# Patient Record
Sex: Male | Born: 1950 | Hispanic: No | Marital: Married | Smoking: Former smoker
Health system: Southern US, Community
[De-identification: ages and names within clinical notes are randomized; demographics above are authoritative.]

## PROBLEM LIST (undated history)

## (undated) DIAGNOSIS — Z95828 Presence of other vascular implants and grafts: Secondary | ICD-10-CM

## (undated) DIAGNOSIS — I82409 Acute embolism and thrombosis of unspecified deep veins of unspecified lower extremity: Secondary | ICD-10-CM

## (undated) DIAGNOSIS — S2239XA Fracture of one rib, unspecified side, initial encounter for closed fracture: Secondary | ICD-10-CM

## (undated) DIAGNOSIS — S62619A Displaced fracture of proximal phalanx of unspecified finger, initial encounter for closed fracture: Secondary | ICD-10-CM

## (undated) DIAGNOSIS — I2699 Other pulmonary embolism without acute cor pulmonale: Secondary | ICD-10-CM

## (undated) DIAGNOSIS — D500 Iron deficiency anemia secondary to blood loss (chronic): Secondary | ICD-10-CM

## (undated) DIAGNOSIS — IMO0002 Reserved for concepts with insufficient information to code with codable children: Secondary | ICD-10-CM

## (undated) DIAGNOSIS — D136 Benign neoplasm of pancreas: Secondary | ICD-10-CM

## (undated) DIAGNOSIS — S8263XB Displaced fracture of lateral malleolus of unspecified fibula, initial encounter for open fracture type I or II: Secondary | ICD-10-CM

## (undated) DIAGNOSIS — N200 Calculus of kidney: Secondary | ICD-10-CM

## (undated) DIAGNOSIS — R740 Nonspecific elevation of levels of transaminase and lactic acid dehydrogenase [LDH]: Secondary | ICD-10-CM

## (undated) HISTORY — DX: Acute embolism and thrombosis of unspecified deep veins of unspecified lower extremity: I82.409

## (undated) HISTORY — PX: ROTATOR CUFF REPAIR: SHX139

## (undated) HISTORY — DX: Fracture of one rib, unspecified side, initial encounter for closed fracture: S22.39XA

## (undated) HISTORY — DX: Reserved for concepts with insufficient information to code with codable children: IMO0002

## (undated) HISTORY — DX: Other disorders of bilirubin metabolism: E80.6

## (undated) HISTORY — DX: Benign neoplasm of pancreas: D13.6

## (undated) HISTORY — PX: UMBILICAL HERNIA REPAIR: SHX196

## (undated) HISTORY — DX: Displaced fracture of proximal phalanx of unspecified finger, initial encounter for closed fracture: S62.619A

## (undated) HISTORY — DX: Displaced fracture of lateral malleolus of unspecified fibula, initial encounter for open fracture type I or II: S82.63XB

## (undated) HISTORY — DX: Nonspecific elevation of levels of transaminase and lactic acid dehydrogenase (ldh): R74.0

## (undated) HISTORY — DX: Iron deficiency anemia secondary to blood loss (chronic): D50.0

---

## 2003-07-26 ENCOUNTER — Observation Stay (HOSPITAL_COMMUNITY): Admission: RE | Admit: 2003-07-26 | Discharge: 2003-07-27 | Payer: Self-pay | Admitting: Specialist

## 2005-10-13 HISTORY — PX: LITHOTRIPSY: SUR834

## 2006-10-13 HISTORY — PX: OTHER SURGICAL HISTORY: SHX169

## 2010-10-13 DIAGNOSIS — I2699 Other pulmonary embolism without acute cor pulmonale: Secondary | ICD-10-CM

## 2010-10-13 DIAGNOSIS — Z95828 Presence of other vascular implants and grafts: Secondary | ICD-10-CM

## 2010-10-13 DIAGNOSIS — N200 Calculus of kidney: Secondary | ICD-10-CM

## 2010-10-13 HISTORY — PX: CHOLECYSTECTOMY: SHX55

## 2010-10-13 HISTORY — DX: Calculus of kidney: N20.0

## 2010-10-13 HISTORY — DX: Other pulmonary embolism without acute cor pulmonale: I26.99

## 2010-10-13 HISTORY — DX: Presence of other vascular implants and grafts: Z95.828

## 2010-10-13 HISTORY — PX: ERCP W/ SPHICTEROTOMY: SHX1523

## 2012-12-11 DIAGNOSIS — I82409 Acute embolism and thrombosis of unspecified deep veins of unspecified lower extremity: Secondary | ICD-10-CM

## 2012-12-11 HISTORY — DX: Acute embolism and thrombosis of unspecified deep veins of unspecified lower extremity: I82.409

## 2012-12-25 ENCOUNTER — Emergency Department (HOSPITAL_COMMUNITY): Payer: BC Managed Care – PPO

## 2012-12-25 ENCOUNTER — Inpatient Hospital Stay (HOSPITAL_COMMUNITY)
Admission: EM | Admit: 2012-12-25 | Discharge: 2012-12-29 | DRG: 128 | Disposition: A | Discharge status: Home / Self Care | Payer: BC Managed Care – PPO | Attending: Internal Medicine | Admitting: Internal Medicine

## 2012-12-25 ENCOUNTER — Encounter (HOSPITAL_COMMUNITY): Payer: Self-pay

## 2012-12-25 ENCOUNTER — Inpatient Hospital Stay (HOSPITAL_COMMUNITY): Payer: BC Managed Care – PPO

## 2012-12-25 DIAGNOSIS — I82401 Acute embolism and thrombosis of unspecified deep veins of right lower extremity: Secondary | ICD-10-CM

## 2012-12-25 DIAGNOSIS — M79609 Pain in unspecified limb: Secondary | ICD-10-CM

## 2012-12-25 DIAGNOSIS — I80203 Phlebitis and thrombophlebitis of unspecified deep vessels of lower extremities, bilateral: Secondary | ICD-10-CM

## 2012-12-25 DIAGNOSIS — I2699 Other pulmonary embolism without acute cor pulmonale: Secondary | ICD-10-CM

## 2012-12-25 DIAGNOSIS — I80209 Phlebitis and thrombophlebitis of unspecified deep vessels of unspecified lower extremity: Secondary | ICD-10-CM | POA: Diagnosis present

## 2012-12-25 DIAGNOSIS — D62 Acute posthemorrhagic anemia: Secondary | ICD-10-CM | POA: Diagnosis present

## 2012-12-25 DIAGNOSIS — D696 Thrombocytopenia, unspecified: Secondary | ICD-10-CM | POA: Diagnosis present

## 2012-12-25 DIAGNOSIS — I82409 Acute embolism and thrombosis of unspecified deep veins of unspecified lower extremity: Secondary | ICD-10-CM

## 2012-12-25 DIAGNOSIS — Z87891 Personal history of nicotine dependence: Secondary | ICD-10-CM

## 2012-12-25 DIAGNOSIS — Z87442 Personal history of urinary calculi: Secondary | ICD-10-CM

## 2012-12-25 DIAGNOSIS — Z86718 Personal history of other venous thrombosis and embolism: Secondary | ICD-10-CM

## 2012-12-25 DIAGNOSIS — I82403 Acute embolism and thrombosis of unspecified deep veins of lower extremity, bilateral: Secondary | ICD-10-CM

## 2012-12-25 DIAGNOSIS — D649 Anemia, unspecified: Secondary | ICD-10-CM | POA: Diagnosis present

## 2012-12-25 DIAGNOSIS — M7989 Other specified soft tissue disorders: Secondary | ICD-10-CM

## 2012-12-25 DIAGNOSIS — I80299 Phlebitis and thrombophlebitis of other deep vessels of unspecified lower extremity: Principal | ICD-10-CM | POA: Diagnosis present

## 2012-12-25 HISTORY — DX: Calculus of kidney: N20.0

## 2012-12-25 HISTORY — DX: Presence of other vascular implants and grafts: Z95.828

## 2012-12-25 HISTORY — DX: Other pulmonary embolism without acute cor pulmonale: I26.99

## 2012-12-25 LAB — COMPREHENSIVE METABOLIC PANEL
ALT: 12 U/L (ref 0–53)
Alkaline Phosphatase: 81 U/L (ref 39–117)
CO2: 21 mEq/L (ref 19–32)
GFR calc Af Amer: 75 mL/min — ABNORMAL LOW (ref >90)
GFR calc non Af Amer: 65 mL/min — ABNORMAL LOW (ref >90)
Glucose, Bld: 131 mg/dL — ABNORMAL HIGH (ref 70–99)
Potassium: 3.5 mEq/L (ref 3.5–5.1)
Sodium: 133 mEq/L — ABNORMAL LOW (ref 135–145)

## 2012-12-25 LAB — FIBRINOGEN
Fibrinogen: 280 mg/dL (ref 204–475)
Fibrinogen: 309 mg/dL (ref 204–475)

## 2012-12-25 LAB — CBC
HCT: 33.8 % — ABNORMAL LOW (ref 39.0–52.0)
MCHC: 34.9 g/dL (ref 30.0–36.0)
Platelets: 113 10*3/uL — ABNORMAL LOW (ref 150–400)
RDW: 12.6 % (ref 11.5–15.5)
WBC: 7.1 10*3/uL (ref 4.0–10.5)

## 2012-12-25 LAB — CBC WITH DIFFERENTIAL/PLATELET
Eosinophils Relative: 2 % (ref 0–5)
Hemoglobin: 12.7 g/dL — ABNORMAL LOW (ref 13.0–17.0)
Lymphocytes Relative: 15 % (ref 12–46)
Lymphs Abs: 1.2 10*3/uL (ref 0.7–4.0)
MCV: 89.3 fL (ref 78.0–100.0)
Monocytes Relative: 10 % (ref 3–12)
Neutrophils Relative %: 72 % (ref 43–77)
Platelets: 132 10*3/uL — ABNORMAL LOW (ref 150–400)
RBC: 4.11 MIL/uL — ABNORMAL LOW (ref 4.22–5.81)
WBC: 7.8 10*3/uL (ref 4.0–10.5)

## 2012-12-25 LAB — URINALYSIS, ROUTINE W REFLEX MICROSCOPIC
Hgb urine dipstick: NEGATIVE
Specific Gravity, Urine: >1.046 — ABNORMAL HIGH (ref 1.005–1.030)
Urobilinogen, UA: 1 mg/dL (ref 0.0–1.0)
pH: 7.5 (ref 5.0–8.0)

## 2012-12-25 LAB — HEPARIN LEVEL (UNFRACTIONATED)
Heparin Unfractionated: 0.74 IU/mL — ABNORMAL HIGH (ref 0.30–0.70)
Heparin Unfractionated: 0.94 IU/mL — ABNORMAL HIGH (ref 0.30–0.70)

## 2012-12-25 LAB — MRSA PCR SCREENING: MRSA by PCR: NEGATIVE

## 2012-12-25 MED ORDER — SODIUM CHLORIDE 0.9 % IV SOLN: 250.0000 mL | INTRAVENOUS | Status: DC | PRN

## 2012-12-25 MED ORDER — IOHEXOL 300 MG/ML  SOLN
100.0000 mL | Freq: Once | INTRAMUSCULAR | Status: AC | PRN
  Administered 2012-12-25: 100 mL via INTRAVENOUS

## 2012-12-25 MED ORDER — IOHEXOL 300 MG/ML  SOLN: 30.0000 mL | Freq: Once | INTRAMUSCULAR | Status: AC | PRN

## 2012-12-25 MED ORDER — MORPHINE SULFATE 4 MG/ML IJ SOLN
INTRAMUSCULAR | Status: AC
  Filled 2012-12-25: qty 1

## 2012-12-25 MED ORDER — OXYCODONE HCL 5 MG PO TABS: 5.0000 mg | ORAL_TABLET | ORAL | Status: DC | PRN

## 2012-12-25 MED ORDER — HYDROMORPHONE HCL PF 1 MG/ML IJ SOLN
1.0000 mg | Freq: Once | INTRAMUSCULAR | Status: AC
  Administered 2012-12-25: 1 mg via INTRAVENOUS
  Filled 2012-12-25: qty 1

## 2012-12-25 MED ORDER — HEPARIN (PORCINE) IN NACL 100-0.45 UNIT/ML-% IJ SOLN
1100.0000 [IU]/h | INTRAMUSCULAR | Status: DC
  Filled 2012-12-25: qty 250

## 2012-12-25 MED ORDER — ONDANSETRON HCL 4 MG/2ML IJ SOLN: 4.0000 mg | Freq: Four times a day (QID) | INTRAMUSCULAR | Status: DC | PRN

## 2012-12-25 MED ORDER — FENTANYL CITRATE 0.05 MG/ML IJ SOLN
INTRAMUSCULAR | Status: AC
  Filled 2012-12-25: qty 6

## 2012-12-25 MED ORDER — SODIUM CHLORIDE 0.9 % IV SOLN
INTRAVENOUS | Status: DC
  Administered 2012-12-25: 16:00:00 via INTRAVENOUS

## 2012-12-25 MED ORDER — ENOXAPARIN SODIUM 80 MG/0.8ML ~~LOC~~ SOLN
80.0000 mg | Freq: Two times a day (BID) | SUBCUTANEOUS | Status: DC
  Administered 2012-12-25: 80 mg via SUBCUTANEOUS
  Filled 2012-12-25 (×2): qty 0.8

## 2012-12-25 MED ORDER — ALUM & MAG HYDROXIDE-SIMETH 200-200-20 MG/5ML PO SUSP: 30.0000 mL | Freq: Four times a day (QID) | ORAL | Status: DC | PRN

## 2012-12-25 MED ORDER — IOHEXOL 300 MG/ML  SOLN
50.0000 mL | Freq: Once | INTRAMUSCULAR | Status: AC | PRN
  Administered 2012-12-25: 50 mL via ORAL

## 2012-12-25 MED ORDER — SODIUM CHLORIDE 0.9 % IV SOLN
0.2500 mg/h | INTRAVENOUS | Status: DC
  Administered 2012-12-25 – 2012-12-26 (×2): 0.25 mg/h via INTRAVENOUS
  Filled 2012-12-25 (×4): qty 1

## 2012-12-25 MED ORDER — ACETAMINOPHEN 325 MG PO TABS: 650.0000 mg | ORAL_TABLET | Freq: Four times a day (QID) | ORAL | Status: DC | PRN

## 2012-12-25 MED ORDER — FENTANYL CITRATE 0.05 MG/ML IJ SOLN
INTRAMUSCULAR | Status: AC | PRN
  Administered 2012-12-25: 100 ug via INTRAVENOUS

## 2012-12-25 MED ORDER — ACETAMINOPHEN 650 MG RE SUPP: 650.0000 mg | Freq: Four times a day (QID) | RECTAL | Status: DC | PRN

## 2012-12-25 MED ORDER — MORPHINE SULFATE 2 MG/ML IJ SOLN
1.0000 mg | INTRAMUSCULAR | Status: DC | PRN
  Administered 2012-12-25: 1 mg via INTRAVENOUS
  Filled 2012-12-25: qty 1

## 2012-12-25 MED ORDER — SODIUM CHLORIDE 0.9 % IJ SOLN
3.0000 mL | Freq: Two times a day (BID) | INTRAMUSCULAR | Status: DC
  Administered 2012-12-26 – 2012-12-29 (×5): 3 mL via INTRAVENOUS

## 2012-12-25 MED ORDER — LIDOCAINE HCL 1 % IJ SOLN
INTRAMUSCULAR | Status: AC
  Filled 2012-12-25: qty 20

## 2012-12-25 MED ORDER — SODIUM CHLORIDE 0.9 % IJ SOLN: 3.0000 mL | INTRAMUSCULAR | Status: DC | PRN

## 2012-12-25 MED ORDER — HYDROMORPHONE HCL PF 1 MG/ML IJ SOLN
1.0000 mg | INTRAMUSCULAR | Status: DC | PRN
  Administered 2012-12-25 – 2012-12-27 (×7): 1 mg via INTRAVENOUS
  Filled 2012-12-25 (×8): qty 1

## 2012-12-25 MED ORDER — TENECTEPLASE 50 MG IV KIT
0.2500 mg/h | PACK | INTRAVENOUS | Status: DC
  Filled 2012-12-25: qty 1

## 2012-12-25 MED ORDER — HYDROMORPHONE HCL PF 1 MG/ML IJ SOLN
1.0000 mg | INTRAMUSCULAR | Status: DC | PRN
  Administered 2012-12-25: 1 mg via INTRAVENOUS
  Filled 2012-12-25: qty 1

## 2012-12-25 MED ORDER — HEPARIN (PORCINE) IN NACL 100-0.45 UNIT/ML-% IJ SOLN
800.0000 [IU]/h | INTRAMUSCULAR | Status: DC
  Filled 2012-12-25: qty 250

## 2012-12-25 MED ORDER — SODIUM CHLORIDE 0.9 % IV SOLN
INTRAVENOUS | Status: AC
  Administered 2012-12-25 (×2): via INTRAVENOUS

## 2012-12-25 MED ORDER — ONDANSETRON HCL 4 MG/2ML IJ SOLN: 4.0000 mg | Freq: Three times a day (TID) | INTRAMUSCULAR | Status: AC | PRN

## 2012-12-25 MED ORDER — POLYETHYLENE GLYCOL 3350 17 G PO PACK
17.0000 g | PACK | Freq: Every day | ORAL | Status: DC | PRN
  Filled 2012-12-25: qty 1

## 2012-12-25 MED ORDER — TENECTEPLASE 50 MG IV KIT
0.2500 mg/h | PACK | INTRAVENOUS | Status: DC
  Administered 2012-12-25 – 2012-12-27 (×3): 0.25 mg/h via INTRAVENOUS
  Filled 2012-12-25 (×2): qty 1

## 2012-12-25 MED ORDER — HEPARIN (PORCINE) IN NACL 100-0.45 UNIT/ML-% IJ SOLN
1000.0000 [IU]/h | INTRAMUSCULAR | Status: DC
  Administered 2012-12-25: 1000 [IU]/h via INTRAVENOUS
  Filled 2012-12-25: qty 250

## 2012-12-25 MED ORDER — MORPHINE SULFATE 10 MG/ML IJ SOLN
INTRAMUSCULAR | Status: AC | PRN
  Administered 2012-12-25: 2 mg via INTRAVENOUS

## 2012-12-25 MED ORDER — ONDANSETRON HCL 4 MG/2ML IJ SOLN
4.0000 mg | Freq: Once | INTRAMUSCULAR | Status: AC
  Administered 2012-12-25: 4 mg via INTRAVENOUS
  Filled 2012-12-25: qty 2

## 2012-12-25 NOTE — Procedures (Signed)
BLE venous lysis Find - Extensive RLE DVT, L CIV DVT, IVC occlusion Int - BLE popliteal vein acess infusion of TNK 0.25 mg/hr each Comp

## 2012-12-25 NOTE — Progress Notes (Signed)
ANTICOAGULATION CONSULT NOTE - Initial Consult  Pharmacy Consult for Lovenox Indication: New DVT  No Known Allergies  Patient Measurements: Height: 5\' 8"  (172.7 cm) Weight: 172 lb 8 oz (78.245 kg) IBW/kg (Calculated) : 63.9  Vital Signs: Temp: 97.8 F (36.6 C) (03/15 0525) Temp src: Oral (03/15 0525) BP: 133/85 mmHg (03/15 0525) Pulse Rate: 102 (03/15 0525)  Labs:  Recent Labs  12/25/12 0550  HGB 12.7*  HCT 36.7*  PLT 132*  APTT 26  CREATININE 1.17    Estimated Creatinine Clearance: 62.5 ml/min (by C-G formula based on Cr of 1.17).   Medical History: Past Medical History  Diagnosis Date  . Kidney stones   . Pulmonary embolus   . S/P IVC filter     Assessment: 40 yom with h/o PE, s/p IVC filter presented 3/15 with c/o back pain radiating down to R leg/knee.  Patient was not anticoagulated PTA. Preliminary RLE venous duplex positive for extensive, nonocclusive, acute deep vein thrombosis involving the right saphenofemoral junction, common femoral, femoral, popliteal, posterior tibial, and peroneal veins.  MD ordered for Lovenox dosing for VTE treatment.   Patient wts 78.2 kg with a CrCl of 62 ml/min. Baseline Hgb 12.7 and Plts slightly low at 132K.    Goal of Therapy:  Anti-Xa level 0.6-1.2 units/ml 4hrs after LMWH dose given Monitor platelets by anticoagulation protocol: Yes   Plan:   Lovenox 80 mg sq q12h  Follow up plts, changes in weight/renal function, long-term anticoagulation plans  Geoffry Paradise, PharmD, BCPS Pager: [phone] 9:15 AM Pharmacy #: [id number]

## 2012-12-25 NOTE — Progress Notes (Signed)
Report called to [facility] on ICU/Stepdown.  Philomena Doheny RN

## 2012-12-25 NOTE — ED Provider Notes (Signed)
Medical screening examination/treatment/procedure(s) were conducted as a shared visit with non-physician practitioner(s) and myself.  I personally evaluated the patient during the encounter  Pt with lower extremity pain and swelling concerning for DVT. vasuclar study to evaluate for lower extremity DVT. Will CT abd/pelvis. No hypoxia or significant tachycardia at this time to suggest PE. Will obtain cxr. Studies pending at time of my evaluation. Care to be continued by my PA and Dr Juleen China.   Lyanne Co, MD 12/25/12 [phone]

## 2012-12-25 NOTE — Progress Notes (Signed)
Brief Pharmacy Consult Note: Heparin  Please see previous Pharmacy consult note from 09:10 this am for full details. MD now changing from lovenox to IV heparin. Patient already received 80mg  lovenox at approximately 10am today, therefore, will not need to begin heparin until tonight.  Plan:  At 8pm tonight, will begin IV heparin 1100 units/hr without a bolus  Check heparin level in 8hrs   Daily heparin levels and CBC  Loralee Pacas, PharmD, BCPS Pager: [phone] 12/25/2012 12:56 PM

## 2012-12-25 NOTE — ED Provider Notes (Signed)
History     CSN: [id number]  Arrival date & time 12/25/12  4098   First MD Initiated Contact with Patient 12/25/12 [phone]      Chief Complaint  Patient presents with  . Back Pain    (Consider location/radiation/quality/duration/timing/severity/associated sxs/prior treatment) HPI  62 year old male with history of kidney stones, history of PE status post IVC filter presents complaining of multiple symptoms. Patient states 3 days ago he was doing some heavy lifting and experienced some vague pain in his low back. Pain felt like a muscle strain, throbbing and tightness in sensation, improves when he rests and worsen when he walks. For the past 2 days he notice increasing pain to his right thigh with sensation of tightness to his thigh. Pain is throbbing sensation, with minimal pain when he rests but when walking he gets out of breath, having dyspnea on exertion, having nonproductive cough, and felt nauseated. Pain now seems to be more in his right lower quadrant. He has no appetite. He has decreased bowel movement with small amount of loose stool but no blood or mucus.  Patient denies fever but endorsed chills. He denies chest pain but endorsed shortness of breath on exertion. He denies dysuria, hematuria or flank pain. He has not noticed any swelling in his testicle or scrotum. No penile discharge. Has had history of kidney stone the past requiring lithotripsy. Prior hx of abd surgery and cholecystectomy. Not a smoker or a drinker.  Pt reports he did take blood thinner medication for 6 months but were taken off of it.  Does not know reason why.  Report having internal bleeding around that same time and was admitted to ICU.  Did not take blood thinner afterward.   Past Medical History  Diagnosis Date  . Kidney stones   . Pulmonary embolus   . S/P IVC filter     Past Surgical History  Procedure Laterality Date  . Cholecystectomy    . Abdominal surgery      History reviewed. No pertinent  family history.  History  Substance Use Topics  . Smoking status: Former Games developer  . Smokeless tobacco: Not on file  . Alcohol Use: No      Review of Systems  Constitutional:       A complete 10 system review of systems was obtained and all systems are negative except as noted in the HPI and PMH.    Allergies  Review of patient's allergies indicates not on file.  Home Medications  No current outpatient prescriptions on file.  BP 133/85  Pulse 102  Temp(Src) 97.8 F (36.6 C) (Oral)  Resp 24  Ht 5\' 8"  (1.727 m)  Wt 172 lb 8 oz (78.245 kg)  BMI 26.23 kg/m2  SpO2 100%  Physical Exam  Nursing note and vitals reviewed. Constitutional: He appears well-developed and well-nourished. No distress.  Awake, alert, nontoxic appearance  HENT:  Head: Atraumatic.  Eyes: Conjunctivae are normal. Right eye exhibits no discharge. Left eye exhibits no discharge.  Neck: Normal range of motion. Neck supple.  Cardiovascular: Regular rhythm.   Mild tachycardia  Pulmonary/Chest: Effort normal. No respiratory distress. He exhibits no tenderness.  Abdominal: Soft. There is no tenderness (RLQ tenderness without guarding or rebound tenderness.  No CVA tenderness, no hernia). There is no rebound. Hernia confirmed negative in the right inguinal area and confirmed negative in the left inguinal area.  Genitourinary: Testes normal and penis normal. Circumcised.  Musculoskeletal: He exhibits edema (RLE edema, 1+ non pitting.  pedal  pulses palpable but faint.  positive Homan Sign.  ). He exhibits no tenderness.  ROM appears intact, no obvious focal weakness  Negative straight leg raise.  Lymphadenopathy:       Right: No inguinal adenopathy present.       Left: No inguinal adenopathy present.  Neurological: He is alert.  Skin: Skin is warm and dry. No rash noted.  Psychiatric: He has a normal mood and affect.    ED Course  Procedures (including critical care time)  6:36 AM Patient was seen and  evaluated by me for his complaints. Primary concern is possible DVT as patient exhibits right lower extremity edema and has had prior history of PE. Patient also has abdominal tenderness primarily to the right lower quadrant. No obvious hernia noted but due to his age we'll consider abdominal pelvis CT scan for further evaluation. Patient also endorsed shortness of breath, and detail exertion, will obtain chest x-ray. Pain medication often, patient declined. Care discussed with attending.  9:05 AM Vascular ultrasound shows extensive acute thrombosis throughout right lower extremities. Abdominal and pelvic CT scan shows slight soft tissue stranding around the distal inferior vena cava with distention of the distal IVC and iliac vein and femoral vein suggesting acute thrombosis below the IVC filter. Incidental finding of diverticulosis were noted.    9:14 AM On reexamination, pt's RLE is bluish in color suggestive of Phlegmasia cerulea dolens, which is considered to have high mortality/morbidity.  Dorsalis Pedis appreciated using bedside doppler US.  Pt is having significant leg pain after having the vascular US.  Will give pain medication and will consider admission for further management and pain control.  Lovenox dose per pharmacy.  Discuss care with attending.    9:42 AM I have consulted with [facility], Dr. Darnelle Catalan, who agrees to admit pt to [address], [place], under her care.  Holding order and bed request placed. Pt is currently stable, pain is more controlled with pain meds.    Labs Reviewed  CBC WITH DIFFERENTIAL - Abnormal; Notable for the following:    RBC 4.11 (*)    Hemoglobin 12.7 (*)    HCT 36.7 (*)    Platelets 132 (*)    All other components within normal limits  COMPREHENSIVE METABOLIC PANEL - Abnormal; Notable for the following:    Sodium 133 (*)    Glucose, Bld 131 (*)    Total Bilirubin 2.8 (*)    GFR calc non Af Amer 65 (*)    GFR calc Af Amer 75 (*)    All other components  within normal limits  APTT  LIPASE, BLOOD  URINALYSIS, ROUTINE W REFLEX MICROSCOPIC   Dg Chest 2 View  12/25/2012  *RADIOLOGY REPORT*  Clinical Data: Increase shortness of breath with activity.  CHEST - 2 VIEW  Comparison: None.  Findings: Normal heart size and pulmonary vascularity.  Scattered calcified granulomas in the lungs with calcified left hilar lymph nodes.  No focal consolidation or airspace disease.  No blunting of costophrenic angles.  No pneumothorax.  Degenerative changes in the spine.  Surgical clips in the right upper quadrant.  Postoperative change in the right shoulder.  IMPRESSION: No evidence of active pulmonary disease.   Original Report Authenticated By: Burman Nieves, M.D.    Ct Abdomen Pelvis W Contrast  12/25/2012  *RADIOLOGY REPORT*  Clinical Data: Abdominal pain and back pain.  CT ABDOMEN AND PELVIS WITH CONTRAST  Technique:  Multidetector CT imaging of the abdomen and pelvis was performed following  the standard protocol during bolus administration of intravenous contrast.  Contrast: 50mL OMNIPAQUE IOHEXOL 300 MG/ML  SOLN, OMNIPAQUE IOHEXOL 300 MG/ML  SOLN  Comparison: None.  Findings: The patient has an IVC filter in place.  The lower IVC and common iliac and femoral veins are distended and there is slight soft tissue stranding around the distal inferior vena cava. I suspect the patient may have acutely trapped a large thrombus in the inferior vena cava below the filter.  The liver, spleen, pancreas, adrenal glands, and kidneys demonstrate no significant abnormalities.  There is an 18 x 11 x 13 mm lesion in the anterior aspect of the left lobe the liver, probably a small hemangioma.  There is a small parapelvic cyst in the left kidney.  There are a few calcified granulomas in the spleen. Gallbladder has been removed.  There are numerous diverticula in the descending and proximal sigmoid portions of the colon.  No significant osseous abnormality.  IMPRESSION:  1. Slight  soft tissue stranding around the distal inferior vena cava with distention of the distal IVC and iliac veins and femoral vein suggesting acute thrombosis below the IVC filter. 2. Diverticulosis.   Original Report Authenticated By: Francene Boyers, M.D.    Author: Gwendolyn Fill Service: Vascular Lab Author Type: Cardiovascular Sonographer   Filed: 12/25/2012 8:54 AM Note Time: 12/25/2012 8:50 AM     Related Notes: Original Note by Gwendolyn Fill filed at 12/25/2012 8:52 AM       *Preliminary Results*  Right lower extremity venous duplex completed.  Right lower extremity is positive for extensive, nonocclusive, acute deep vein thrombosis involving the right saphenofemoral junction, common femoral, femoral, popliteal, posterior tibial, and peroneal veins. There is no evidence of deep vein thrombosis involving the left saphenofemoral junction or common femoral vein. No evidence of right Baker's cyst.  Preliminary results discussed with Fayrene Helper, PA  12/25/2012 8:52 AM  Gertie Fey, RDMS, RDCS     CRITICAL CARE Performed by: Fayrene Helper   Total critical care time: 40 min  Critical care time was exclusive of separately billable procedures and treating other patients.  Critical care was necessary to treat or prevent imminent or life-threatening deterioration.  Critical care was time spent personally by me on the following activities: development of treatment plan with patient and/or surrogate as well as nursing, discussions with consultants, evaluation of patient's response to treatment, examination of patient, obtaining history from patient or surrogate, ordering and performing treatments and interventions, ordering and review of laboratory studies, ordering and review of radiographic studies, pulse oximetry and re-evaluation of patient's condition.   1. DVT, lower extremity, right    BP 133/85  Pulse 102  Temp(Src) 97.8 F (36.6 C) (Oral)  Resp 24  Ht 5\' 8"  (1.727 m)   Wt 172 lb 8 oz (78.245 kg)  BMI 26.23 kg/m2  SpO2 100%  I have reviewed nursing notes and vital signs. I personally reviewed the imaging tests through PACS system  I reviewed available ER/hospitalization records thought the EMR    MDM          Fayrene Helper, PA-C 12/25/12 8469

## 2012-12-25 NOTE — H&P (Signed)
[facility] History and Physical  ORLANDO DEVEREUX JXB:[id number] DOB: Jun 01, 1951 DOA: 12/25/2012  Referring physician: Dr. Raeford Razor PCP: No primary provider on file. the patient confirms that he does not have a current PCP.  Chief Complaint: RLE swollen    History of Present Illness: Jonathan Ellison is an 62 y.o. male with a PMH nephrolithiasis, diverticulosis, and DVT/PE treated with coumadin x 6 months, status post IVC filter, with no ongoing followup with a PCP, who presents to the hospital with progressive right lower extremity swelling and pain which has worsened over the past 24 hours. Weightbearing and activity exacerbates pain and the pain is so bad now that is interfering with his ability to ambulate. The patient has a history of remote cholecystectomy, and what sounds like an ERCP, complicated by hemorrhage, but this appears to have been a direct complication from the procedure rather than from Coumadin. Patient states that he was off the Coumadin when he underwent the cholecystectomy. The patient originally thought that he was having a flare of his diverticulitis, as he reported lower abdominal and back pain, and subsequently put himself on bowel rest. His initial blood clot appears to been provoked by lithotripsy. Upon initial evaluation emergency department, the patient was found to have a markedly edematous and dusky appearing right leg extending from the foot up to the thigh. The ER physician was able to Doppler a pulse although his pulses were nonpalpable tactilely.  Review of Systems: Constitutional: No fever, + chills;  Appetite diminished; No weight loss, + weight gain.  HEENT: No blurry vision, no diplopia, no pharyngitis, no dysphagia CV: No chest pain, no palpitations.  Resp: + SOB with activity, no cough. GI: + nausea, no vomiting, no diarrhea, no melena, no hematochezia.  GU: No dysuria, no hematuria.  MSK: no myalgias except LLE pain and swelling, + knee arthralgias.   Neuro:  No headache, no focal neurological deficits, no history of seizures.  Psych: No depression, no anxiety.  Endo: No thyroid disease, no DM, no heat intolerance, no cold intolerance, no polyuria, no polydipsia  Skin: No rashes, no skin lesions.  Heme: No easy bruising, no history of blood diseases.  Past Medical History Past Medical History  Diagnosis Date  . Kidney stones   . Pulmonary embolus   . S/P IVC filter      Past Surgical History Past Surgical History  Procedure Laterality Date  . Cholecystectomy    . Abdominal surgery    . Rotator cuff surgery       Social History: History   Social History  . Marital Status: Married    Spouse Name: Alice    Number of Children: 3  . Years of Education: N/A   Occupational History  . Works PT    Social History Main Topics  . Smoking status: Former Games developer  . Smokeless tobacco: Not on file     Comment: Quit smoking 30 years ago.  . Alcohol Use: No  . Drug Use: No  . Sexually Active: Not on file   Other Topics Concern  . Not on file   Social History Narrative   Married.  Independent of ADLs and with ambulation.    Family History:  Family History  Problem Relation Age of Onset  . Cancer Mother     Pancreatic  . Cancer Brother     Head and neck  . Dementia Father     Allergies: Review of patient's allergies indicates no known allergies.  Meds:  Prior to Admission medications   Not on File    Physical Exam: Filed Vitals:   12/25/12 0532 12/25/12 0900 12/25/12 1000 12/25/12 1100  BP:  128/88 101/80 126/72  Pulse:    71  Temp:    98.3 F (36.8 C)  TempSrc:    Oral  Resp:    32  Height: 5\' 8"  (1.727 m)     Weight: 78.245 kg (172 lb 8 oz)     SpO2:    100%     Physical Exam: Blood pressure 126/72, pulse 71, temperature 98.3 F (36.8 C), temperature source Oral, resp. rate 32, height 5\' 8"  (1.727 m), weight 78.245 kg (172 lb 8 oz), SpO2 100.00%. Gen: In moderate distress from severe right leg  pain. Head: Normocephalic, atraumatic. Eyes: PERRL, EOMI, sclerae nonicteric. Mouth: Oropharynx clear with fair dentition. Neck: Supple, no thyromegaly, no lymphadenopathy, no jugular venous distention. Chest: Lungs clear to auscultation bilaterally. CV: Heart sounds regular, without murmurs, rubs, or gallops. Abdomen: Soft, nontender, nondistended with normal active bowel sounds. Extremities: Right lower extremity markedly swollen, dusky, without palpable pedal pulses. Left lower terminate normal. Skin: Warm and dry except for the right leg which is dusky and cool. Neuro: Alert and oriented times 3; cranial nerves II through XII grossly intact. Psych: Mood and affect normal.  Labs on Admission:  Basic Metabolic Panel:  Recent Labs Lab 12/25/12 0550  NA 133*  K 3.5  CL 98  CO2 21  GLUCOSE 131*  BUN 20  CREATININE 1.17  CALCIUM 9.5   Liver Function Tests:  Recent Labs Lab 12/25/12 0550  AST 15  ALT 12  ALKPHOS 81  BILITOT 2.8*  PROT 7.1  ALBUMIN 3.8    Recent Labs Lab 12/25/12 0731  LIPASE 44   CBC:  Recent Labs Lab 12/25/12 0550  WBC 7.8  NEUTROABS 5.6  HGB 12.7*  HCT 36.7*  MCV 89.3  PLT 132*    Radiological Exams on Admission:  Dg Chest 2 View 12/25/2012  IMPRESSION: No evidence of active pulmonary disease.   Original Report Authenticated By: Burman Nieves, M.D.     Ct Abdomen Pelvis W Contrast 12/25/2012   IMPRESSION:  1. Slight soft tissue stranding around the distal inferior vena cava with distention of the distal IVC and iliac veins and femoral vein suggesting acute thrombosis below the IVC filter. 2. Diverticulosis.   Original Report Authenticated By: Francene Boyers, M.D.     EKG: Independently reviewed. Normal sinus rhythm at 66 beats per minute, left axis deviation.  Assessment/Plan Principal Problem:   Phlegmasia cerulea dolens -Will treat with heparin for now. -Spoke with Dr. Jen Mow of vascular surgery who will see the patient in  consultation. Active Problems:   Pain in limb -Opiate narcotics for pain control. -Keep right leg elevated above the level of the heart.   Code Status: Full Family Communication: Fulton Mole (wife) [phone]; cell [phone]. Disposition Plan: Home when stable  Time spent: One hour.  RAMA,CHRISTINA [facility] Pager [phone]  If 7PM-7AM, please contact night-coverage [email or web address] Password [email or web address] 12/25/2012, 1:02 PM

## 2012-12-25 NOTE — ED Provider Notes (Signed)
Medical screening examination/treatment/procedure(s) were conducted as a shared visit with non-physician practitioner(s) and myself.  I personally evaluated the patient during the encounter.  61yM with abdominal, back and RLE pain. On exam pt's RLE is diffusely swollen. Slightly cool to touch as compared to L. Dusky bluish appearance, particularly around calf and knee.  I could not palpate DP pulse in either foot but both easily dopplerable. Consistent with phlegmasia cerulea dolens. Diffuse clot noted on [place]. CT with distension from IVC distally with inflammatory changes around distal IVC consistent with clot. Pt not greatest historian. The best I can tell from his report is that IVC filter was placed in setting of concurrent blood clot as well as bleeding complication of cholecystectomy precluding anticoagulation.  Does not appear to be acute contraindication for anticoagulation at this time. Heparin and admit.      Raeford Razor, MD 12/25/12 [phone]

## 2012-12-25 NOTE — Progress Notes (Addendum)
*  Preliminary Results* Right lower extremity venous duplex completed. Right lower extremity is positive for extensive, nonocclusive, acute deep vein thrombosis involving the right saphenofemoral junction, common femoral, femoral, popliteal, posterior tibial, and peroneal veins. There is no evidence of deep vein thrombosis involving the left saphenofemoral junction or common femoral vein. No evidence of right Baker's cyst. Preliminary results discussed with Fayrene Helper, PA  12/25/2012 8:52 AM Gertie Fey, RDMS, RDCS

## 2012-12-25 NOTE — ED Notes (Addendum)
Pet pt, back pain since wed.  Pt states pain lower and bilateral to back.  Pain/tingling radiating down right leg to knees.  Pt has hx of kidney stones and PE.  Pt has two filters for clots. Pt states darker urine than normal. No n/v/d.  No fever.  Pt has shakes in triage.

## 2012-12-25 NOTE — ED Notes (Signed)
EKG given to EDP, Patria Mane, MD.

## 2012-12-25 NOTE — ED Notes (Signed)
Pt. Made aware for the need of urine. 

## 2012-12-25 NOTE — Consult Note (Signed)
[facility] [place]  Patient name: Jonathan Ellison MRN: [id number] DOB: January 10, 1951 Sex: male  REASON FOR CONSULT: Swollen right leg with extensive right lower extremity DVT  HPI: Jonathan Ellison is a 62 y.o. male who has had some intermittent swelling in the right lower extremity for approximately 2-3 weeks. He does not remember any sudden onset of swelling. He denies any acute onset of right lower extremity pain. Today he developed significantly increased pain in the right lower tree with profound swelling. He was admitted to [facility] and underwent a duplex scan which showed an extensive right lower extremity DVT. Vascular surgery was consult for further recommendations. The patient has had a previous DVT. He states that approximately 2-3 years ago after undergoing treatment of kidney stones he developed bilateral lower extremity DVTs and also had pulmonary emboli. He had an IVC filter placed at that time. He states that he was on Coumadin for approximately 6 months. He denies any family history of clotting disorders. The patient denies any acute onset chest pain. He denies pleuritic chest pain. He does have some shortness of breath with exertion. He denies any previous history of claudication, rest pain, or nonhealing ulcers.   Past Medical History  Diagnosis Date  . Kidney stones   . Pulmonary embolus   . S/P IVC filter   He denies any history of diabetes, hypertension, hypercholesterolemia, history of previous myocardial infarction or history of congestive heart failure.  Family History  Problem Relation Age of Onset  . Cancer Mother     Pancreatic  . Cancer Brother     Head and neck  . Dementia Father   There is no family history of clotting disorders that he is aware of. He denies any history of premature cardiovascular disease.  SOCIAL HISTORY: History  Substance Use Topics  . Smoking status: Former Games developer  . Smokeless tobacco: Not on file      Comment: Quit smoking 30 years ago.  . Alcohol Use: No  He is married. He has 3 children. He quit tobacco 31 years ago.   No Known Allergies  Current Facility-Administered Medications  Medication Dose Route Frequency Provider Last Rate Last Dose  . 0.9 %  sodium chloride infusion   Intravenous STAT Fayrene Helper, PA-C 75 mL/hr at 12/25/12 1000    . acetaminophen (TYLENOL) tablet 650 mg  650 mg Oral Q6H PRN Maryruth Bun Rama, MD       Or  . acetaminophen (TYLENOL) suppository 650 mg  650 mg Rectal Q6H PRN Maryruth Bun Rama, MD      . alum & mag hydroxide-simeth (MAALOX/MYLANTA) 200-200-20 MG/5ML suspension 30 mL  30 mL Oral Q6H PRN Christina P Rama, MD      . heparin ADULT infusion 100 units/mL (25000 units/250 mL)  1,100 Units/hr Intravenous Continuous Rollene Fare, RPH      . HYDROmorphone (DILAUDID) injection 1 mg  1 mg Intravenous Q1H PRN Maryruth Bun Rama, MD      . ondansetron (ZOFRAN) injection 4 mg  4 mg Intravenous Q8H PRN Fayrene Helper, PA-C      . oxyCODONE (Oxy IR/ROXICODONE) immediate release tablet 5-10 mg  5-10 mg Oral Q4H PRN Christina P Rama, MD      . polyethylene glycol (MIRALAX / GLYCOLAX) packet 17 g  17 g Oral Daily PRN Maryruth Bun Rama, MD        REVIEW OF SYSTEMS: Arly.Keller ] denotes positive finding; [  ] denotes negative  finding CARDIOVASCULAR:  [ ]  chest pain   [ ]  chest pressure   [ ]  palpitations   [ ]  orthopnea   Arly.Keller ] dyspnea on exertion   [ ]  claudication   [ ]  rest pain   [ ]  DVT   [ ]  phlebitis PULMONARY:   [ ]  productive cough   [ ]  asthma   [ ]  wheezing NEUROLOGIC:   [ ]  weakness  [ ]  paresthesias  [ ]  aphasia  [ ]  amaurosis  [ ]  dizziness HEMATOLOGIC:   [ ]  bleeding problems   [ ]  clotting disorders MUSCULOSKELETAL:  [ ]  joint pain   [ ]  joint swelling [ ]  leg swelling GASTROINTESTINAL: [ ]   blood in stool  [ ]   hematemesis GENITOURINARY:  [ ]   dysuria  [ ]   hematuria PSYCHIATRIC:  [ ]  history of major depression INTEGUMENTARY:  [ ]  rashes  [ ]   ulcers CONSTITUTIONAL:  [ ]  fever   [ ]  chills  PHYSICAL EXAM: Filed Vitals:   12/25/12 0532 12/25/12 0900 12/25/12 1000 12/25/12 1100  BP:  128/88 101/80 126/72  Pulse:    71  Temp:    98.3 F (36.8 C)  TempSrc:    Oral  Resp:    32  Height: 5\' 8"  (1.727 m)     Weight: 172 lb 8 oz (78.245 kg)     SpO2:    100%   Body mass index is 26.23 kg/(m^2). GENERAL: The patient is a well-nourished male, in no acute distress. The vital signs are documented above. CARDIOVASCULAR: There is a regular rate and rhythm. I do not detect carotid bruits. He has palpable femoral pulses bilaterally. He has a palpable left popliteal and left dorsalis pedis pulse. He has extensive swelling of the entire right lower extremity up to the groin. Because of the swelling I am unable to palpate pulses on the right. He does have a biphasic dorsalis pedis and posterior tibial signal on the right with the Doppler. He has biphasic posterior tibial signal and dorsalis pedis signal on the left. PULMONARY: There is good air exchange bilaterally without wheezing or rales. ABDOMEN: Soft and non-tender with normal pitched bowel sounds.  MUSCULOSKELETAL: There are no major deformities or cyanosis. NEUROLOGIC: No focal weakness or paresthesias are detected. SKIN: There are no ulcers or rashes noted. PSYCHIATRIC: The patient has a normal affect.  DATA:  Lab Results  Component Value Date   WBC 7.8 12/25/2012   HGB 12.7* 12/25/2012   HCT 36.7* 12/25/2012   MCV 89.3 12/25/2012   PLT 132* 12/25/2012   Lab Results  Component Value Date   NA 133* 12/25/2012   K 3.5 12/25/2012   CL 98 12/25/2012   CO2 21 12/25/2012   Lab Results  Component Value Date   CREATININE 1.17 12/25/2012   Venous duplex scan shows extensive DVT involving the right common femoral vein femoral vein popliteal vein and tibial veins. Also the saphenofemoral junction is involved.  CT SCAN OF THE ABDOMEN: Appears to have clot in the inferior vena cava and iliac  veins below the IVC filter.  MEDICAL ISSUES: PHLEGMASIA CERULEA DOLENS: This patient presents with acute onset of profound right lower extremity swelling. He currently has excellent arterial signals with the Doppler. He has been placed on intravenous heparin. Position his leg to maximize venous drainage with the back flat, knee slightly bent, ankle above the knee, and the above the hip. I have recommended that interventional radiology evaluate the patient  for possible thrombolysis. I do not see any obvious contraindications to this. He did have a injury to his goal many years ago and had a plate placed but I do not think he's had any intracranial surgery. He denies any bleeding issues. Explained that there are no surgical options for this. If he were not a candidate for thrombolysis then the only option would be anticoagulated she and and leg elevation with significant risk of long-term post thrombotic syndrome.have discussed the case with Dr. Hillery Aldo. Vascular surgery we'll be available as needed.  Zenia Guest S [facility] [place]: [phone]

## 2012-12-25 NOTE — H&P (Signed)
Jonathan Ellison is an 62 y.o. male.   Chief Complaint: RLE swelling HPI: 2 day history of increasing pain and swelling in right leg. He has a Hx of DVT and filter placement (after hemorrhage during ERCP). He describes episodic LE swelling worrisome for BLE venous insufficiency. He had one dose of lovenox. He is not on coumadin. 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 He denies Hx of brain tumor, CVA, Recent CPR.  Past Medical History  Diagnosis Date  . Kidney stones   . Pulmonary embolus   . S/P IVC filter     Past Surgical History  Procedure Laterality Date  . Cholecystectomy    . Abdominal surgery    . Rotator cuff surgery      Family History  Problem Relation Age of Onset  . Cancer Mother     Pancreatic  . Cancer Brother     Head and neck  . Dementia Father    Social History:  reports that he has quit smoking. He does not have any smokeless tobacco history on file. He reports that he does not drink alcohol or use illicit drugs.  Allergies: No Known Allergies  No prescriptions prior to admission    Results for orders placed during the hospital encounter of 12/25/12 (from the past 48 hour(s))  CBC WITH DIFFERENTIAL     Status: Abnormal   Collection Time    12/25/12  5:50 AM      Result Value Range   WBC 7.8  4.0 - 10.5 K/uL   RBC 4.11 (*) 4.22 - 5.81 MIL/uL   Hemoglobin 12.7 (*) 13.0 - 17.0 g/dL   HCT 11.9 (*) 14.7 - 82.9 %   MCV 89.3  78.0 - 100.0 fL   MCH 30.9  26.0 - 34.0 pg   MCHC 34.6  30.0 - 36.0 g/dL   RDW 56.2  13.0 - 86.5 %   Platelets 132 (*) 150 - 400 K/uL   Neutrophils Relative 72  43 - 77 %   Neutro Abs 5.6  1.7 - 7.7 K/uL   Lymphocytes Relative 15  12 - 46 %   Lymphs Abs 1.2  0.7 - 4.0 K/uL   Monocytes Relative 10  3 - 12 %   Monocytes Absolute 0.8  0.1 - 1.0 K/uL   Eosinophils Relative 2  0 - 5 %   Eosinophils Absolute 0.1  0.0 - 0.7 K/uL   Basophils Relative 0  0 - 1 %   Basophils Absolute 0.0  0.0 - 0.1  K/uL  COMPREHENSIVE METABOLIC PANEL     Status: Abnormal   Collection Time    12/25/12  5:50 AM      Result Value Range   Sodium 133 (*) 135 - 145 mEq/L   Potassium 3.5  3.5 - 5.1 mEq/L   Chloride 98  96 - 112 mEq/L   CO2 21  19 - 32 mEq/L   Glucose, Bld 131 (*) 70 - 99 mg/dL   BUN 20  6 - 23 mg/dL   Creatinine, Ser 7.84  0.50 - 1.35 mg/dL   Calcium 9.5  8.4 - 69.6 mg/dL   Total Protein 7.1  6.0 - 8.3 g/dL   Albumin 3.8  3.5 - 5.2 g/dL   AST 15  0 - 37 U/L   ALT 12  0 - 53 U/L   Alkaline Phosphatase 81  39 - 117 U/L   Total Bilirubin 2.8 (*) 0.3 - 1.2 mg/dL  GFR calc non Af Amer 65 (*) >90 mL/min   GFR calc Af Amer 75 (*) >90 mL/min   Comment:            The eGFR has been calculated     using the CKD EPI equation.     This calculation has not been     validated in all clinical     situations.     eGFR's persistently     <90 mL/min signify     possible Chronic Kidney Disease.  APTT     Status: None   Collection Time    12/25/12  5:50 AM      Result Value Range   aPTT 26  24 - 37 seconds  LIPASE, BLOOD     Status: None   Collection Time    12/25/12  7:31 AM      Result Value Range   Lipase 44  11 - 59 U/L  URINALYSIS, ROUTINE W REFLEX MICROSCOPIC     Status: Abnormal   Collection Time    12/25/12 10:15 AM      Result Value Range   Color, Urine YELLOW  YELLOW   APPearance CLEAR  CLEAR   Specific Gravity, Urine >1.046 (*) 1.005 - 1.030   pH 7.5  5.0 - 8.0   Glucose, UA NEGATIVE  NEGATIVE mg/dL   Hgb urine dipstick NEGATIVE  NEGATIVE   Bilirubin Urine NEGATIVE  NEGATIVE   Ketones, ur 40 (*) NEGATIVE mg/dL   Protein, ur NEGATIVE  NEGATIVE mg/dL   Urobilinogen, UA 1.0  0.0 - 1.0 mg/dL   Nitrite NEGATIVE  NEGATIVE   Leukocytes, UA NEGATIVE  NEGATIVE   Comment: MICROSCOPIC NOT DONE ON URINES WITH NEGATIVE PROTEIN, BLOOD, LEUKOCYTES, NITRITE, OR GLUCOSE <1000 mg/dL.  CK     Status: None   Collection Time    12/25/12  1:32 PM      Result Value Range   Total CK 57  7  - 232 U/L  LACTIC ACID, PLASMA     Status: Abnormal   Collection Time    12/25/12  1:38 PM      Result Value Range   Lactic Acid, Venous 3.3 (*) 0.5 - 2.2 mmol/L   Dg Chest 2 View  12/25/2012  *RADIOLOGY REPORT*  Clinical Data: Increase shortness of breath with activity.  CHEST - 2 VIEW  Comparison: None.  Findings: Normal heart size and pulmonary vascularity.  Scattered calcified granulomas in the lungs with calcified left hilar lymph nodes.  No focal consolidation or airspace disease.  No blunting of costophrenic angles.  No pneumothorax.  Degenerative changes in the spine.  Surgical clips in the right upper quadrant.  Postoperative change in the right shoulder.  IMPRESSION: No evidence of active pulmonary disease.   Original Report Authenticated By: Burman Nieves, M.D.    Ct Abdomen Pelvis W Contrast  12/25/2012  *RADIOLOGY REPORT*  Clinical Data: Abdominal pain and back pain.  CT ABDOMEN AND PELVIS WITH CONTRAST  Technique:  Multidetector CT imaging of the abdomen and pelvis was performed following the standard protocol during bolus administration of intravenous contrast.  Contrast: 50mL OMNIPAQUE IOHEXOL 300 MG/ML  SOLN, OMNIPAQUE IOHEXOL 300 MG/ML  SOLN  Comparison: None.  Findings: The patient has an IVC filter in place.  The lower IVC and common iliac and femoral veins are distended and there is slight soft tissue stranding around the distal inferior vena cava. I suspect the patient may have acutely trapped a large  thrombus in the inferior vena cava below the filter.  The liver, spleen, pancreas, adrenal glands, and kidneys demonstrate no significant abnormalities.  There is an 18 x 11 x 13 mm lesion in the anterior aspect of the left lobe the liver, probably a small hemangioma.  There is a small parapelvic cyst in the left kidney.  There are a few calcified granulomas in the spleen. Gallbladder has been removed.  There are numerous diverticula in the descending and proximal sigmoid  portions of the colon.  No significant osseous abnormality.  IMPRESSION:  1. Slight soft tissue stranding around the distal inferior vena cava with distention of the distal IVC and iliac veins and femoral vein suggesting acute thrombosis below the IVC filter. 2. Diverticulosis.   Original Report Authenticated By: Francene Boyers, M.D.     ROS  Blood pressure 126/72, pulse 71, temperature 98.3 F (36.8 C), temperature source Oral, resp. rate 32, height 5\' 8"  (1.727 m), weight 172 lb 8 oz (78.245 kg), SpO2 100.00%. Physical Exam  Constitutional: He is oriented to person, place, and time. He appears well-developed and well-nourished.  Neck: Neck supple.  Cardiovascular: Normal rate and regular rhythm.   Respiratory: Effort normal and breath sounds normal.  GI: Soft.  Musculoskeletal:  RLE - 2+ swelling. DP/PT pulses dopplerable. Blueish discoloration  LLE - No edema. Pulses intact  Neurological: He is alert and oriented to person, place, and time.  Psychiatric: He has a normal mood and affect.     Assessment/Plan He has B iliac vein DVT based on CT. There is extensive RLE DVT. Will do B approach for LE venous lysis.  Alice Vitelli,ART A 12/25/2012, 3:19 PM

## 2012-12-26 ENCOUNTER — Inpatient Hospital Stay (HOSPITAL_COMMUNITY): Payer: BC Managed Care – PPO

## 2012-12-26 LAB — FIBRINOGEN
Fibrinogen: 325 mg/dL (ref 204–475)
Fibrinogen: 251 mg/dL (ref 204–475)

## 2012-12-26 LAB — CBC
HCT: 29.8 % — ABNORMAL LOW (ref 39.0–52.0)
Hemoglobin: 10.9 g/dL — ABNORMAL LOW (ref 13.0–17.0)
Hemoglobin: 10.4 g/dL — ABNORMAL LOW (ref 13.0–17.0)
MCH: 31 pg (ref 26.0–34.0)
MCH: 31.4 pg (ref 26.0–34.0)
MCHC: 34 g/dL (ref 30.0–36.0)
MCHC: 34.9 g/dL (ref 30.0–36.0)
RDW: 13 % (ref 11.5–15.5)
RDW: 12.5 % (ref 11.5–15.5)

## 2012-12-26 LAB — HEPARIN LEVEL (UNFRACTIONATED): Heparin Unfractionated: 0.43 IU/mL (ref 0.30–0.70)

## 2012-12-26 MED ORDER — HEPARIN (PORCINE) IN NACL 100-0.45 UNIT/ML-% IJ SOLN
600.0000 [IU]/h | INTRAMUSCULAR | Status: DC
  Filled 2012-12-26: qty 250

## 2012-12-26 MED ORDER — IOHEXOL 300 MG/ML  SOLN: 40.0000 mL | Freq: Once | INTRAMUSCULAR | Status: AC | PRN

## 2012-12-26 NOTE — Progress Notes (Signed)
ANTICOAGULATION CONSULT NOTE - Initial Consult  Pharmacy Consult for Heparin Indication: New DVT  No Known Allergies  Patient Measurements: Height: 5\' 8"  (172.7 cm) Weight: 172 lb 8 oz (78.245 kg) IBW/kg (Calculated) : 63.9  Vital Signs: Temp: 98.6 F (37 C) (03/16 0400) Temp src: Oral (03/16 0400) BP: 109/60 mmHg (03/16 0700) Pulse Rate: 66 (03/16 0700)  Labs:  Recent Labs  12/25/12 0550 12/25/12 1332 12/25/12 1800 12/25/12 2320 12/26/12 0710  HGB 12.7*  --  11.8*  --  10.9*  HCT 36.7*  --  33.8*  --  32.1*  PLT 132*  --  113*  --  89*  APTT 26  --   --   --   --   HEPARINUNFRC  --   --  0.74* 0.94* 0.43  CREATININE 1.17  --   --   --   --   CKTOTAL  --  57  --   --   --     Estimated Creatinine Clearance: 62.5 ml/min (by C-G formula based on Cr of 1.17).   Medical History: Past Medical History  Diagnosis Date  . Kidney stones   . Pulmonary embolus   . S/P IVC filter     Assessment: 8 yom with h/o PE, s/p IVC filter presented 3/15 with c/o back pain radiating down to R leg/knee.  Patient was not anticoagulated PTA. Preliminary RLE venous duplex positive for extensive, nonocclusive, acute deep vein thrombosis involving the right saphenofemoral junction, common femoral, femoral, popliteal, posterior tibial, and peroneal veins.  MD initially ordered for Lovenox dosing for VTE treatment.   Patient received one dose of Lovenox 80mg  on 3/15 at 10am, then changed to IV heparin in anticipation of vascular procedure - BLE venous lysis  3/15 pm, MD ordered Tenecteplase @ 0.25mg /hr via bilateral LE popliteal vein access, in addition to IV heparin   Heparin levels had been supratherapeutic overnight, requiring multiple rate decreases  Heparin level now therapeutic (0.41) on 500 units/hr  Pltc decreased to 89k this am - monitor carefully for bleeding (none reported per RN)  Goal of Therapy:  Heparin Level = 0.2-0.5 while Tenecteplase infusing Monitor platelets by  anticoagulation protocol: Yes   Plan:   Continue heparin 500 units/hr  Check heparin level in 6 hr to verify therapeutic  Tenecteplase continues to infuse, therefore, continue to maintain lower goal of 0.2-0.5  Continue to follow daily heparin level, CBC  Follow up plans for oral anticoagulation  Loralee Pacas, PharmD, BCPS Pager: [phone] 12/26/12 @ 8:20 AM

## 2012-12-26 NOTE — Progress Notes (Signed)
ANTICOAGULATION CONSULT NOTE - Initial Consult  Pharmacy Consult for Heparin Indication: New DVT  No Known Allergies  Patient Measurements: Height: 5\' 8"  (172.7 cm) Weight: 172 lb 8 oz (78.245 kg) IBW/kg (Calculated) : 63.9  Vital Signs: Temp: 99.4 F (37.4 C) (03/16 1600) Temp src: Oral (03/16 1600) BP: 115/56 mmHg (03/16 1400) Pulse Rate: 76 (03/16 1400)  Labs:  Recent Labs  12/25/12 0550 12/25/12 1332  12/25/12 1800 12/25/12 2320 12/26/12 0710 12/26/12 1612  HGB 12.7*  --   --  11.8*  --  10.9* 10.4*  HCT 36.7*  --   --  33.8*  --  32.1* 29.8*  PLT 132*  --   --  113*  --  89* 79*  APTT 26  --   --   --   --   --   --   HEPARINUNFRC  --   --   < > 0.74* 0.94* 0.43 0.15*  CREATININE 1.17  --   --   --   --   --   --   CKTOTAL  --  57  --   --   --   --   --   < > = values in this interval not displayed.  Estimated Creatinine Clearance: 62.5 ml/min (by C-G formula based on Cr of 1.17).   Medical History: Past Medical History  Diagnosis Date  . Kidney stones   . Pulmonary embolus   . S/P IVC filter     Assessment: 47 yom with h/o PE, s/p IVC filter presented 3/15 with c/o back pain radiating down to R leg/knee.  Patient was not anticoagulated PTA. Preliminary RLE venous duplex positive for extensive, nonocclusive, acute deep vein thrombosis involving the right saphenofemoral junction, common femoral, femoral, popliteal, posterior tibial, and peroneal veins.  MD initially ordered for Lovenox dosing for VTE treatment.   Patient received one dose of Lovenox 80mg  on 3/15 at 10am, then changed to IV heparin in anticipation of vascular procedure - BLE venous lysis  3/15 pm, MD ordered Tenecteplase @ 0.25mg /hr via bilateral LE popliteal vein access, in addition to IV heparin   Heparin running at 500 units/hr, first heparin level therapeutic but confirmatory level low at 0.15.    Pltc decreased to 89k this am - monitor carefully for bleeding (per RN Darl Pikes), pt went  to IR today for dressing changes and is bleeding from sites - will continue to monitor)  Goal of Therapy:  Heparin Level = 0.2-0.5 while Tenecteplase infusing Monitor platelets by anticoagulation protocol: Yes   Plan:   Increase heparin to 600 units/hr  Check heparin level in 6 hr to verify therapeutic  Tenecteplase continues to infuse, therefore, continue to maintain lower goal of 0.2-0.5  Continue to follow daily heparin level, CBC  Follow up plans for oral anticoagulation  Geoffry Paradise, PharmD, BCPS Pager: [phone] 5:10 PM Pharmacy #: [id number]

## 2012-12-26 NOTE — Progress Notes (Addendum)
[facility] PROGRESS NOTE  [name]:[id number] DOB: 1951-06-24 DOA: 12/25/2012 PCP: No primary provider on file.  Brief narrative: Jonathan Ellison is an 62 y.o. male with a past medical history of DVT/PE status post IVC filter who was admitted on 12/25/2012 with phlegmasia cerulea dolens and extensive DVT.  Assessment/Plan: Principal Problem:   Phlegmasia cerulea dolens -Patient was seen by Dr. Edilia Bo of vascular surgery and Dr. Bonnielee Haff of interventional radiology and underwent thrombolysis emergently secondary to concerns for limb ischemia. -Subsequently placed on heparin. -Being monitored closely in the ICU post procedure. -To be re-evaluated by IR this afternoon. Active Problems:   Pain in limb -Analgesia as needed.  Code Status: Full  Family Communication: Fulton Mole (wife) [phone]; cell [phone].  Disposition Plan: Home when stable  Medical Consultants:  Dr. Maryclare Bean, Interventional Radiology  Dr. Waverly Ferrari, Vascular Surgery  Other Consultants:  None.  Anti-infectives:  None.  HPI/Subjective: Jonathan Ellison is feeling better today. He has less pain in the right leg. Denies dyspnea and chest pain. Hungry, but still just on clear liquids in anticipation of going back down to interventional radiology for further thrombolysis. Objective: Filed Vitals:   12/26/12 0300 12/26/12 0400 12/26/12 0500 12/26/12 0600  BP: 100/71 109/60 121/69 110/64  Pulse: 60 62 61 60  Temp:  98.6 F (37 C)    TempSrc:  Oral    Resp: 17 18 24 19   Height:      Weight:      SpO2: 95% 95% 100% 100%    Intake/Output Summary (Last 24 hours) at 12/26/12 0711 Last data filed at 12/26/12 0600  Gross per 24 hour  Intake   1085 ml  Output    525 ml  Net    560 ml    Exam: Gen:  NAD Cardiovascular:  RRR, No M/R/G Respiratory:  Lungs CTAB Gastrointestinal:  Abdomen soft, NT/ND, + BS Extremities:  RLE softer, less dusky, 2+ pedal pulse.  Data Reviewed: Basic  Metabolic Panel:  Recent Labs Lab 12/25/12 0550  NA 133*  K 3.5  CL 98  CO2 21  GLUCOSE 131*  BUN 20  CREATININE 1.17  CALCIUM 9.5   GFR Estimated Creatinine Clearance: 62.5 ml/min (by C-G formula based on Cr of 1.17). Liver Function Tests:  Recent Labs Lab 12/25/12 0550  AST 15  ALT 12  ALKPHOS 81  BILITOT 2.8*  PROT 7.1  ALBUMIN 3.8    Recent Labs Lab 12/25/12 0731  LIPASE 44    CBC:  Recent Labs Lab 12/25/12 0550 12/25/12 1800  WBC 7.8 7.1  NEUTROABS 5.6  --   HGB 12.7* 11.8*  HCT 36.7* 33.8*  MCV 89.3 90.6  PLT 132* 113*   Cardiac Enzymes:  Recent Labs Lab 12/25/12 1332  CKTOTAL 57   Microbiology Recent Results (from the past 240 hour(s))  MRSA PCR SCREENING     Status: None   Collection Time    12/25/12  4:57 PM      Result Value Range Status   MRSA by PCR NEGATIVE  NEGATIVE Final   Comment:            The GeneXpert MRSA Assay (FDA     approved for NASAL specimens     only), is one component of a     comprehensive MRSA colonization     surveillance program. It is not     intended to diagnose MRSA     infection nor to guide or  monitor treatment for     MRSA infections.     Procedures and Diagnostic Studies: Dg Chest 2 View  12/25/2012  *RADIOLOGY REPORT*  Clinical Data: Increase shortness of breath with activity.  CHEST - 2 VIEW  Comparison: None.  Findings: Normal heart size and pulmonary vascularity.  Scattered calcified granulomas in the lungs with calcified left hilar lymph nodes.  No focal consolidation or airspace disease.  No blunting of costophrenic angles.  No pneumothorax.  Degenerative changes in the spine.  Surgical clips in the right upper quadrant.  Postoperative change in the right shoulder.  IMPRESSION: No evidence of active pulmonary disease.   Original Report Authenticated By: Burman Nieves, M.D.    Ct Abdomen Pelvis W Contrast  12/25/2012  *RADIOLOGY REPORT*  Clinical Data: Abdominal pain and back pain.  CT  ABDOMEN AND PELVIS WITH CONTRAST  Technique:  Multidetector CT imaging of the abdomen and pelvis was performed following the standard protocol during bolus administration of intravenous contrast.  Contrast: 50mL OMNIPAQUE IOHEXOL 300 MG/ML  SOLN, OMNIPAQUE IOHEXOL 300 MG/ML  SOLN  Comparison: None.  Findings: The patient has an IVC filter in place.  The lower IVC and common iliac and femoral veins are distended and there is slight soft tissue stranding around the distal inferior vena cava. I suspect the patient may have acutely trapped a large thrombus in the inferior vena cava below the filter.  The liver, spleen, pancreas, adrenal glands, and kidneys demonstrate no significant abnormalities.  There is an 18 x 11 x 13 mm lesion in the anterior aspect of the left lobe the liver, probably a small hemangioma.  There is a small parapelvic cyst in the left kidney.  There are a few calcified granulomas in the spleen. Gallbladder has been removed.  There are numerous diverticula in the descending and proximal sigmoid portions of the colon.  No significant osseous abnormality.  IMPRESSION:  1. Slight soft tissue stranding around the distal inferior vena cava with distention of the distal IVC and iliac veins and femoral vein suggesting acute thrombosis below the IVC filter. 2. Diverticulosis.   Original Report Authenticated By: Francene Boyers, M.D.     Scheduled Meds: . sodium chloride  3 mL Intravenous Q12H   Continuous Infusions: . sodium chloride 20 mL/hr at 12/25/12 1615  . heparin 500 Units/hr (12/26/12 0023)  . tenecteplase (TNKase) infusion 0.25 mg/hr (12/25/12 1630)  . tenecteplase (TNKase) infusion 0.25 mg/hr (12/25/12 1647)    Time spent: 25 minutes.   LOS: 1 day   RAMA,CHRISTINA  [facility] Pager [phone].  If 8PM-8AM, please contact night-coverage at [email or web address], password [facility] 12/26/2012, 7:11 AM

## 2012-12-26 NOTE — Procedures (Signed)
BLE lysis check post 20 hrs Find- nearly resolved DVT in R femoral system. Improved DVT in B iliac veins and IVC.  No comp

## 2012-12-26 NOTE — Progress Notes (Signed)
ANTICOAGULATION CONSULT NOTE - Initial Consult  Pharmacy Consult for Heparin Indication: New DVT  No Known Allergies  Patient Measurements: Height: 5\' 8"  (172.7 cm) Weight: 172 lb 8 oz (78.245 kg) IBW/kg (Calculated) : 63.9  Vital Signs: Temp: 98.3 F (36.8 C) (03/15 2000) Temp src: Oral (03/15 2000) BP: 106/64 mmHg (03/16 0000) Pulse Rate: 65 (03/16 0000)  Labs:  Recent Labs  12/25/12 0550 12/25/12 1332 12/25/12 1800 12/25/12 2320  HGB 12.7*  --  11.8*  --   HCT 36.7*  --  33.8*  --   PLT 132*  --  113*  --   APTT 26  --   --   --   HEPARINUNFRC  --   --  0.74* 0.94*  CREATININE 1.17  --   --   --   CKTOTAL  --  57  --   --     Estimated Creatinine Clearance: 62.5 ml/min (by C-G formula based on Cr of 1.17).   Medical History: Past Medical History  Diagnosis Date  . Kidney stones   . Pulmonary embolus   . S/P IVC filter     Assessment: 50 yom with h/o PE, s/p IVC filter presented 3/15 with c/o back pain radiating down to R leg/knee.  Patient was not anticoagulated PTA. Preliminary RLE venous duplex positive for extensive, nonocclusive, acute deep vein thrombosis involving the right saphenofemoral junction, common femoral, femoral, popliteal, posterior tibial, and peroneal veins.  MD initially ordered for Lovenox dosing for VTE treatment.   Patient received one dose of Lovenox 80mg  ~ 10am on 3/15 then plan changed to use IV heparin instead of Lovenox.  Original orders to begin heparin @ 8pm on /15 at a rate of 1100 units/hr  At 16:15 MD ordered Tenecteplase @ 0.25mg /hr with IV heparin to begin @ 1000 units/hr, which was started at 16:45  Heparin level = 0.94 with heparin infusing @ 800 units/hr (goal 0.2-0.5 while Tenecteplase infusing) Goal of Therapy:  Heparin Level = 0.2-0.5 while Tenecteplase infusing Monitor platelets by anticoagulation protocol: Yes   Plan:   Decrease IV heparin to 500 units/hr  Tenecteplase continues to infuse @ 0.25  mg/hr  Check heparin level 6 hr after rate change  Continue to follow daily heparin level, CBC  Terrilee Files, PharmD 12/26/12 @ 12:10 AM

## 2012-12-27 ENCOUNTER — Other Ambulatory Visit (HOSPITAL_COMMUNITY): Payer: BC Managed Care – PPO

## 2012-12-27 ENCOUNTER — Inpatient Hospital Stay (HOSPITAL_COMMUNITY): Payer: BC Managed Care – PPO

## 2012-12-27 DIAGNOSIS — D649 Anemia, unspecified: Secondary | ICD-10-CM | POA: Diagnosis present

## 2012-12-27 DIAGNOSIS — D696 Thrombocytopenia, unspecified: Secondary | ICD-10-CM | POA: Diagnosis not present

## 2012-12-27 LAB — CBC
MCH: 31.9 pg (ref 26.0–34.0)
MCHC: 35.7 g/dL (ref 30.0–36.0)
MCV: 89.4 fL (ref 78.0–100.0)
Platelets: 73 10*3/uL — ABNORMAL LOW (ref 150–400)
RDW: 12.5 % (ref 11.5–15.5)

## 2012-12-27 LAB — FIBRINOGEN: Fibrinogen: 141 mg/dL — ABNORMAL LOW (ref 204–475)

## 2012-12-27 LAB — TECHNOLOGIST SMEAR REVIEW

## 2012-12-27 MED ORDER — ENOXAPARIN SODIUM 80 MG/0.8ML ~~LOC~~ SOLN
80.0000 mg | Freq: Two times a day (BID) | SUBCUTANEOUS | Status: DC
  Administered 2012-12-28 – 2012-12-29 (×3): 80 mg via SUBCUTANEOUS
  Filled 2012-12-27 (×6): qty 0.8

## 2012-12-27 MED ORDER — IOHEXOL 300 MG/ML  SOLN
50.0000 mL | Freq: Once | INTRAMUSCULAR | Status: AC | PRN
  Administered 2012-12-27: 70 mL via INTRAVENOUS

## 2012-12-27 MED ORDER — MIDAZOLAM HCL 2 MG/2ML IJ SOLN: 1.0000 mg | INTRAMUSCULAR | Status: DC | PRN

## 2012-12-27 MED ORDER — MORPHINE SULFATE 2 MG/ML IJ SOLN: 2.0000 mg | INTRAMUSCULAR | Status: DC | PRN

## 2012-12-27 MED ORDER — ENOXAPARIN SODIUM 80 MG/0.8ML ~~LOC~~ SOLN
80.0000 mg | Freq: Once | SUBCUTANEOUS | Status: AC
  Administered 2012-12-27: 80 mg via SUBCUTANEOUS
  Filled 2012-12-27: qty 0.8

## 2012-12-27 MED ORDER — HEPARIN (PORCINE) IN NACL 100-0.45 UNIT/ML-% IJ SOLN
700.0000 [IU]/h | INTRAMUSCULAR | Status: DC
  Administered 2012-12-27: 700 [IU]/h via INTRAVENOUS
  Filled 2012-12-27: qty 250

## 2012-12-27 MED ORDER — WARFARIN SODIUM 7.5 MG PO TABS
7.5000 mg | ORAL_TABLET | Freq: Once | ORAL | Status: AC
  Administered 2012-12-27: 7.5 mg via ORAL
  Filled 2012-12-27: qty 1

## 2012-12-27 MED ORDER — HEPARIN (PORCINE) IN NACL 100-0.45 UNIT/ML-% IJ SOLN
850.0000 [IU]/h | INTRAMUSCULAR | Status: DC
  Filled 2012-12-27: qty 250

## 2012-12-27 MED ORDER — FENTANYL CITRATE 0.05 MG/ML IJ SOLN
INTRAMUSCULAR | Status: AC
  Filled 2012-12-27: qty 4

## 2012-12-27 MED ORDER — WARFARIN VIDEO
Freq: Once | Status: AC
  Administered 2012-12-27: 18:00:00

## 2012-12-27 MED ORDER — LORAZEPAM 2 MG/ML IJ SOLN: 1.0000 mg | INTRAMUSCULAR | Status: DC | PRN

## 2012-12-27 MED ORDER — COUMADIN BOOK
Freq: Once | Status: AC
  Administered 2012-12-27: 18:00:00
  Filled 2012-12-27: qty 1

## 2012-12-27 MED ORDER — WARFARIN - PHARMACIST DOSING INPATIENT: Freq: Every day | Status: DC

## 2012-12-27 MED ORDER — FENTANYL CITRATE 0.05 MG/ML IJ SOLN
INTRAMUSCULAR | Status: AC | PRN
  Administered 2012-12-27 (×2): 12.5 ug via INTRAVENOUS

## 2012-12-27 MED ORDER — LORAZEPAM 2 MG/ML IJ SOLN
INTRAMUSCULAR | Status: AC
  Administered 2012-12-27: 1 mg
  Filled 2012-12-27: qty 1

## 2012-12-27 NOTE — Procedures (Signed)
Completion venogram images from mutli-day catheter directed thrombolysis demonstrates restoration of antegrade flow bilaterally.   As such, procedure was completed. Hemostasis achieved at the bilateral popliteal access sites with manual compression. No immediate post procedural complication.

## 2012-12-27 NOTE — Progress Notes (Signed)
ANTICOAGULATION CONSULT NOTE - F/U Consult  Pharmacy Consult for Heparin Indication: New DVT  No Known Allergies  Patient Measurements: Height: 5\' 8"  (172.7 cm) Weight: 172 lb 8 oz (78.245 kg) IBW/kg (Calculated) : 63.9  Vital Signs: Temp: 100 F (37.8 C) (03/17 0400) Temp src: Oral (03/17 0400) BP: 106/56 mmHg (03/17 0600) Pulse Rate: 73 (03/17 0600)  Labs:  Recent Labs  12/25/12 0550 12/25/12 1332  12/26/12 0710 12/26/12 1612 12/26/12 2315 12/27/12 0500 12/27/12 0825  HGB 12.7*  --   < > 10.9* 10.4*  --  9.6*  --   HCT 36.7*  --   < > 32.1* 29.8*  --  26.9*  --   PLT 132*  --   < > 89* 79*  --  73*  --   APTT 26  --   --   --   --   --   --   --   HEPARINUNFRC  --   --   < > 0.43 0.15* <0.10*  --  <0.10*  CREATININE 1.17  --   --   --   --   --   --   --   CKTOTAL  --  57  --   --   --   --   --   --   < > = values in this interval not displayed.  Estimated Creatinine Clearance: 62.5 ml/min (by C-G formula based on Cr of 1.17).   Medical History: Past Medical History  Diagnosis Date  . Kidney stones   . Pulmonary embolus   . S/P IVC filter     Assessment: 78 yom with h/o PE, s/p IVC filter presented 3/15 with c/o back pain radiating down to R leg/knee.  Patient was not anticoagulated PTA. Preliminary RLE venous duplex positive for extensive, nonocclusive, acute deep vein thrombosis involving the right saphenofemoral junction, common femoral, femoral, popliteal, posterior tibial, and peroneal veins.  MD initially ordered for Lovenox dosing for VTE treatment, but was transitioned to IV Heparin on 3/15.  3/15 pm, MD ordered Tenecteplase @ 0.25mg /hr via bilateral LE popliteal vein access, in addition to IV heparin   Heparin currently running at 700 units/hr.  First heparin level was therapeutic on 600 units/hr, but has since decreased to subtherapeutic level.  HL (< 0.1) remains low, despite rate increases.  RN confirms no infusion interruptions or complications.     Plt (73) and Hgb (9.6) continue to drift down.  Monitor carefully for bleeding.  HIT panel ordered 3/17 per MD.  Goal of Therapy:  Heparin Level = 0.2-0.5 while Tenecteplase infusing Monitor platelets by anticoagulation protocol: Yes   Plan:  Increase heparin IV infusion to 850 units/hr Heparin level 6 hours after starting Daily heparin level and CBC Continue to monitor H&H and platelets, HIT panel when available.  Tenecteplase continues to infuse, therefore, continue to maintain lower goal of 0.2-0.5  Follow up plans for oral anticoagulation  Lynann Beaver PharmD, BCPS Pager [phone] 12/27/2012 9:37 AM

## 2012-12-27 NOTE — Consult Note (Signed)
[facility]  [phone]   ONCOLOGY  HOSPITAL CONSULTATION NOTE  Jonathan Ellison                                MR#: [id number]  DOB: 10/19/1950                       CSN#: [id number]  Referring MD: [facility]  Teaching Service  Dr.   Valera Castle MD: Dr.  Jaquita Rector for Consult: Thrombocytopenia   Jonathan Ellison is a 62 y.o. white male admitted to [facility] with increased right lower extremity pain and swelling, found to have her lower extremity Dopplers right DVT. CT of the abdomen was consistent with IV see the as well as a blood in the iliac veins below the IVC filterHx x-ray on 12/25/2012, showed no evidence of active pulmonary disease. 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 Those records are currently being requested further evaluation this time, it is unclear if hypercoagulable panel was drawn then. At the time, he had pulmonary embolism, and required six-month course of Coumadin.   At the time of admission, no bleeding issues were noted.the patient was placed on Heparin per pharmacy, now on Lovenox and coumadinization. Pklatelets  on admission were 132, then dropping to 89 on 316, with repeat labs of 79, and today at 73,000.I been again was initially 159, and then 141 Mono in and hematocrit were 12.7 and 36.7 on admission, the today at 9.6 and 26.9 respectively.heat and it was ordered today, with results pending at this time.he has undergone thrombolysis on 12/25/2012 by Dr. Edilia Bo with improvement in the flow restauration.no other labs are available for review at this time, to compare.we were requested to see the patient regarding his thrombocytopenia. Smear has been ordered for review. Is nNo family history of hematological disorders. No gum bleed. No epistaxis or hemoptysis. Denies easy bruising. . Patient states that has never had a hematological evaluation  prior to this admission. Never had a bone marrow biopsy.  No transfusions were given during this hospitalization  Denies risk factors for HIV or hepatitis. No hemoptysis or epistaxis. No blood in urine or in stool We were kindly asked to see the patient with recommendations  PMH:  Past Medical History  Diagnosis Date  . Kidney stones   . Pulmonary embolus   . S/P IVC filter     Surgeries:  Past Surgical History  Procedure Laterality Date  . Cholecystectomy    . Abdominal surgery    . Rotator cuff surgery      Allergies: No Known Allergies  Medications:    prior to admission:  No prescriptions prior to admission    . fentaNYL      . sodium chloride  3 mL Intravenous Q12H    YQM:VHQION chloride, acetaminophen, acetaminophen, alum & mag hydroxide-simeth, HYDROmorphone (DILAUDID) injection, midazolam, morphine injection, ondansetron (ZOFRAN) IV, oxyCODONE, polyethylene glycol, sodium chloride  GEX:BMWUXLK due to patient falling back a sleep due to medications. Constitutional: needed  for weight loss.  negative for fever, chills or  night sweats. negative for fatigue.  Eyes: Negative for blurred vision and double vision.  Respiratory: Negative for cough. No hemoptysis. No shortness of breath. No pleuritic chest pain.  Cardiovascular: Negative for chest pain. No palpitations.  GI:  Negative for  nausea, vomiting, diarrhea or constipation. No change in bowel caliber. No  Melena or hematochezia. No abdominal pain.  GU: Negative for hematuria. No loss of urinary control.No urinary retention. Skin: Negative for itching. No rash. No petechia. easy bruising. No gum bleed.  Neurological: No headaches. No motor or sensory deficits.  Family History:    Family History  Problem Relation Age of Onset  . Cancer Mother     Pancreatic  . Cancer Brother     Head and neck  . Dementia Father     No family history of bleeding disorders.  Social History:  reports that he has quit  smoking. He does not have any smokeless tobacco history on file. He reports that he does not drink alcohol or use illicit drugs.  Physical Exam    Filed Vitals:   12/27/12 1446  BP: 114/71  Pulse: 80  Temp:   Resp: 21     Filed Weights   12/25/12 0532  Weight: 172 lb 8 oz (78.245 kg)    General:  76 -year-old  in no acute distress, sound asleep. Exam is limited. HEENT: Normocephalic, atraumatic, PERRLA. Oral cavity without thrush or lesions. No gingival bleeding. Neck supple. no thyromegaly, no cervical or supraclavicular adenopathy  Lungs clear bilaterally anteriorily No wheezing, rhonchi or rales. No axillary masses. Breasts: not examined. Cardiac regular rate and rhythm,no murmur , rubs or gallops Abdomen soft no apparent tenderness, bowel sounds x4. No HSM. No masses palpable.  GU/rectal: deferred. Extremities no clubbing cyanosis, left lower edema. No bruising or petechial rash Musculoskeletal: no spinal tenderness.  Neuro: Non Focal  Labs:     Recent Labs Lab 12/25/12 0550 12/25/12 1800 12/26/12 0710 12/26/12 1612 12/27/12 0500  WBC 7.8 7.1 5.4 5.4 5.4  HGB 12.7* 11.8* 10.9* 10.4* 9.6*  HCT 36.7* 33.8* 32.1* 29.8* 26.9*  PLT 132* 113* 89* 79* 73*  MCV 89.3 90.6 91.2 90.0 89.4  MCH 30.9 31.6 31.0 31.4 31.9  MCHC 34.6 34.9 34.0 34.9 35.7  RDW 12.8 12.6 13.0 12.5 12.5  LYMPHSABS 1.2  --   --   --   --   MONOABS 0.8  --   --   --   --   EOSABS 0.1  --   --   --   --   BASOSABS 0.0  --   --   --   --          Recent Labs Lab 12/25/12 0550  NA 133*  K 3.5  CL 98  CO2 21  GLUCOSE 131*  BUN 20  CREATININE 1.17  CALCIUM 9.5  AST 15  ALT 12  ALKPHOS 81  BILITOT 2.8*        Component Value Date/Time   BILITOT 2.8* 12/25/2012 0550     No results found for this basename: INR, PROTIME,  in the last 168 hours  No results found for this basename: DDIMER,  in the last 72 hours   Anemia panel:  No results found for this basename: VITAMINB12,  FOLATE, FERRITIN, TIBC, IRON, RETICCTPCT,  in the last 72 hours  Urinalysis    Component Value Date/Time   COLORURINE YELLOW 12/25/2012 1015   APPEARANCEUR CLEAR 12/25/2012 1015   LABSPEC >1.046* 12/25/2012 1015   PHURINE 7.5 12/25/2012 1015   GLUCOSEU NEGATIVE 12/25/2012 1015   HGBUR NEGATIVE 12/25/2012 1015   BILIRUBINUR NEGATIVE 12/25/2012 1015   KETONESUR 40* 12/25/2012 1015   PROTEINUR NEGATIVE 12/25/2012 1015   UROBILINOGEN  1.0 12/25/2012 1015   NITRITE NEGATIVE 12/25/2012 1015   LEUKOCYTESUR NEGATIVE 12/25/2012 1015    Drugs of Abuse  No results found for this basename: labopia, cocainscrnur, labbenz, amphetmu, thcu, labbarb     Imaging Studies:  Dg Chest 2 View  12/25/2012  *RADIOLOGY REPORT*  Clinical Data: Increase shortness of breath with activity.  CHEST - 2 VIEW  Comparison: None.  Findings: Normal heart size and pulmonary vascularity.  Scattered calcified granulomas in the lungs with calcified left hilar lymph nodes.  No focal consolidation or airspace disease.  No blunting of costophrenic angles.  No pneumothorax.  Degenerative changes in the spine.  Surgical clips in the right upper quadrant.  Postoperative change in the right shoulder.  IMPRESSION: No evidence of active pulmonary disease.   Original Report Authenticated By: Burman Nieves, M.D.    Ct Abdomen Pelvis W Contrast  12/25/2012  *RADIOLOGY REPORT*  Clinical Data: Abdominal pain and back pain.  CT ABDOMEN AND PELVIS WITH CONTRAST  Technique:  Multidetector CT imaging of the abdomen and pelvis was performed following the standard protocol during bolus administration of intravenous contrast.  Contrast: 50mL OMNIPAQUE IOHEXOL 300 MG/ML  SOLN, OMNIPAQUE IOHEXOL 300 MG/ML  SOLN  Comparison: None.  Findings: The patient has an IVC filter in place.  The lower IVC and common iliac and femoral veins are distended and there is slight soft tissue stranding around the distal inferior vena cava. I suspect the patient may have  acutely trapped a large thrombus in the inferior vena cava below the filter.  The liver, spleen, pancreas, adrenal glands, and kidneys demonstrate no significant abnormalities.  There is an 18 x 11 x 13 mm lesion in the anterior aspect of the left lobe the liver, probably a small hemangioma.  There is a small parapelvic cyst in the left kidney.  There are a few calcified granulomas in the spleen. Gallbladder has been removed.  There are numerous diverticula in the descending and proximal sigmoid portions of the colon.  No significant osseous abnormality.  IMPRESSION:  1. Slight soft tissue stranding around the distal inferior vena cava with distention of the distal IVC and iliac veins and femoral vein suggesting acute thrombosis below the IVC filter. 2. Diverticulosis.   Original Report Authenticated By: Francene Boyers, M.D.    Ir Veno/ext/bi  12/26/2012  *RADIOLOGY REPORT*  Clinical Data/Indication: PLEGMASIA OF THE RIGHT LOWER EXTREMITY. MASSIVE RIGHT LOWER EXTREMITY DVT.  LEFT COMMON ILIAC VEIN DVT.  RADIOLOGY EXAMINATION,IR ULTRASOUND GUIDANCE VASC ACCESS LEFT,IR ULTRASOUND GUIDANCE VASC ACCESS RIGHT,BILATERAL EXTREMITY VENOGRAPHY  Sedation: Versed zero mg, Fentanyl 100 mg.  Total Moderate Sedation Time: 60 minutes.  Contrast Volume: 60 ml Omnipaque-300.  Additional Medications: Morphine 2 mg.  Fluoroscopy Time: 5.7 minutes.  Procedure: The procedure, risks, benefits, and alternatives were explained to the patient. Questions regarding the procedure were encouraged and answered. The patient understands and consents to the procedure.  The bilateral popliteal fossas in the prone position were prepped with betadine in a sterile fashion, and a sterile drape was applied covering the operative field. A sterile gown and sterile gloves were used for the procedure.  Under sonographic guidance, a micropuncture needle was inserted into the left popliteal vein and removed over a 018 wire which was upsized to a [place].  A  7-French sheath was inserted.  The identical procedure was performed in the right popliteal fossa.  Bilateral lower extremity venography was performed.  This confirmed diffuse DVT in the right lower extremity and  occlusive thrombus in the left common iliac vein.  A vertebral catheter was then advanced over a Benson wire across the left common iliac vein occlusion, into the IVC, and above the IVC filter.  It was removed over an exchange length Amplatz wire. The identical procedure was performed from the right popliteal vein.  90 cm length infusion catheters were then inserted.  A 20-cm infusion length was placed across the left common iliac vein occlusion.  A 50 cm infusion length catheter was placed from the right femoral vein to the IVC.  TNK was then instituted at 0.25 mg per hour per catheter.  Peripheral heparin was instituted.  Sheaths were sewn in place and capped open with a sterile normal saline K A V L.  Findings: Left lower extremity venography demonstrates occlusive DVT in the left common iliac veins and IVC to the level of the IVC filter.  Right lower extremity venography demonstrates massive DVT throughout the right femoral vein, popliteal vein, common femoral vein, and iliac venous system.  Complications: None.  IMPRESSION: Venography confirms bilateral DVT.  Infusion was instituted via bilateral popliteal fossa approach with a total of 0.5 mg TNK per hour.   Original Report Authenticated By: Jolaine Click, M.D.    Ir US Guide Vasc Access Left  12/26/2012  *RADIOLOGY REPORT*  Clinical Data/Indication: PLEGMASIA OF THE RIGHT LOWER EXTREMITY. MASSIVE RIGHT LOWER EXTREMITY DVT.  LEFT COMMON ILIAC VEIN DVT.  RADIOLOGY EXAMINATION,IR ULTRASOUND GUIDANCE VASC ACCESS LEFT,IR ULTRASOUND GUIDANCE VASC ACCESS RIGHT,BILATERAL EXTREMITY VENOGRAPHY  Sedation: Versed zero mg, Fentanyl 100 mg.  Total Moderate Sedation Time: 60 minutes.  Contrast Volume: 60 ml Omnipaque-300.  Additional Medications: Morphine 2 mg.   Fluoroscopy Time: 5.7 minutes.  Procedure: The procedure, risks, benefits, and alternatives were explained to the patient. Questions regarding the procedure were encouraged and answered. The patient understands and consents to the procedure.  The bilateral popliteal fossas in the prone position were prepped with betadine in a sterile fashion, and a sterile drape was applied covering the operative field. A sterile gown and sterile gloves were used for the procedure.  Under sonographic guidance, a micropuncture needle was inserted into the left popliteal vein and removed over a 018 wire which was upsized to a [place].  A 7-French sheath was inserted.  The identical procedure was performed in the right popliteal fossa.  Bilateral lower extremity venography was performed.  This confirmed diffuse DVT in the right lower extremity and occlusive thrombus in the left common iliac vein.  A vertebral catheter was then advanced over a Benson wire across the left common iliac vein occlusion, into the IVC, and above the IVC filter.  It was removed over an exchange length Amplatz wire. The identical procedure was performed from the right popliteal vein.  90 cm length infusion catheters were then inserted.  A 20-cm infusion length was placed across the left common iliac vein occlusion.  A 50 cm infusion length catheter was placed from the right femoral vein to the IVC.  TNK was then instituted at 0.25 mg per hour per catheter.  Peripheral heparin was instituted.  Sheaths were sewn in place and capped open with a sterile normal saline K A V L.  Findings: Left lower extremity venography demonstrates occlusive DVT in the left common iliac veins and IVC to the level of the IVC filter.  Right lower extremity venography demonstrates massive DVT throughout the right femoral vein, popliteal vein, common femoral vein, and iliac venous system.  Complications: None.  IMPRESSION: Venography confirms bilateral DVT.  Infusion was instituted via  bilateral popliteal fossa approach with a total of 0.5 mg TNK per hour.   Original Report Authenticated By: Jolaine Click, M.D.    Ir US Guide Vasc Access Right  12/26/2012  *RADIOLOGY REPORT*  Clinical Data/Indication: PLEGMASIA OF THE RIGHT LOWER EXTREMITY. MASSIVE RIGHT LOWER EXTREMITY DVT.  LEFT COMMON ILIAC VEIN DVT.  RADIOLOGY EXAMINATION,IR ULTRASOUND GUIDANCE VASC ACCESS LEFT,IR ULTRASOUND GUIDANCE VASC ACCESS RIGHT,BILATERAL EXTREMITY VENOGRAPHY  Sedation: Versed zero mg, Fentanyl 100 mg.  Total Moderate Sedation Time: 60 minutes.  Contrast Volume: 60 ml Omnipaque-300.  Additional Medications: Morphine 2 mg.  Fluoroscopy Time: 5.7 minutes.  Procedure: The procedure, risks, benefits, and alternatives were explained to the patient. Questions regarding the procedure were encouraged and answered. The patient understands and consents to the procedure.  The bilateral popliteal fossas in the prone position were prepped with betadine in a sterile fashion, and a sterile drape was applied covering the operative field. A sterile gown and sterile gloves were used for the procedure.  Under sonographic guidance, a micropuncture needle was inserted into the left popliteal vein and removed over a 018 wire which was upsized to a [place].  A 7-French sheath was inserted.  The identical procedure was performed in the right popliteal fossa.  Bilateral lower extremity venography was performed.  This confirmed diffuse DVT in the right lower extremity and occlusive thrombus in the left common iliac vein.  A vertebral catheter was then advanced over a Benson wire across the left common iliac vein occlusion, into the IVC, and above the IVC filter.  It was removed over an exchange length Amplatz wire. The identical procedure was performed from the right popliteal vein.  90 cm length infusion catheters were then inserted.  A 20-cm infusion length was placed across the left common iliac vein occlusion.  A 50 cm infusion length  catheter was placed from the right femoral vein to the IVC.  TNK was then instituted at 0.25 mg per hour per catheter.  Peripheral heparin was instituted.  Sheaths were sewn in place and capped open with a sterile normal saline K A V L.  Findings: Left lower extremity venography demonstrates occlusive DVT in the left common iliac veins and IVC to the level of the IVC filter.  Right lower extremity venography demonstrates massive DVT throughout the right femoral vein, popliteal vein, common femoral vein, and iliac venous system.  Complications: None.  IMPRESSION: Venography confirms bilateral DVT.  Infusion was instituted via bilateral popliteal fossa approach with a total of 0.5 mg TNK per hour.   Original Report Authenticated By: Jolaine Click, M.D.    Ir Infusion Thrombol Venous Initial (ms)  12/26/2012  *RADIOLOGY REPORT*  Clinical Data/Indication: PLEGMASIA OF THE RIGHT LOWER EXTREMITY. MASSIVE RIGHT LOWER EXTREMITY DVT.  LEFT COMMON ILIAC VEIN DVT.  RADIOLOGY EXAMINATION,IR ULTRASOUND GUIDANCE VASC ACCESS LEFT,IR ULTRASOUND GUIDANCE VASC ACCESS RIGHT,BILATERAL EXTREMITY VENOGRAPHY  Sedation: Versed zero mg, Fentanyl 100 mg.  Total Moderate Sedation Time: 60 minutes.  Contrast Volume: 60 ml Omnipaque-300.  Additional Medications: Morphine 2 mg.  Fluoroscopy Time: 5.7 minutes.  Procedure: The procedure, risks, benefits, and alternatives were explained to the patient. Questions regarding the procedure were encouraged and answered. The patient understands and consents to the procedure.  The bilateral popliteal fossas in the prone position were prepped with betadine in a sterile fashion, and a sterile drape was applied covering the operative field. A sterile gown and sterile gloves were used for  the procedure.  Under sonographic guidance, a micropuncture needle was inserted into the left popliteal vein and removed over a 018 wire which was upsized to a [place].  A 7-French sheath was inserted.  The identical  procedure was performed in the right popliteal fossa.  Bilateral lower extremity venography was performed.  This confirmed diffuse DVT in the right lower extremity and occlusive thrombus in the left common iliac vein.  A vertebral catheter was then advanced over a Benson wire across the left common iliac vein occlusion, into the IVC, and above the IVC filter.  It was removed over an exchange length Amplatz wire. The identical procedure was performed from the right popliteal vein.  90 cm length infusion catheters were then inserted.  A 20-cm infusion length was placed across the left common iliac vein occlusion.  A 50 cm infusion length catheter was placed from the right femoral vein to the IVC.  TNK was then instituted at 0.25 mg per hour per catheter.  Peripheral heparin was instituted.  Sheaths were sewn in place and capped open with a sterile normal saline K A V L.  Findings: Left lower extremity venography demonstrates occlusive DVT in the left common iliac veins and IVC to the level of the IVC filter.  Right lower extremity venography demonstrates massive DVT throughout the right femoral vein, popliteal vein, common femoral vein, and iliac venous system.  Complications: None.  IMPRESSION: Venography confirms bilateral DVT.  Infusion was instituted via bilateral popliteal fossa approach with a total of 0.5 mg TNK per hour.   Original Report Authenticated By: Jolaine Click, M.D.    Ir Rande Lawman F/u Eval Art/ven Basil Dess Day (ms)  12/26/2012  *RADIOLOGY REPORT*  Clinical Data/Indication: MASSIVE LOWER EXTREMITY DVT  BILATERAL LOWER EXTREMITY VENOUS LYSIS CHECK POST 20 HOURS.  Fluoroscopy Time: 1.0 minutes.  Procedure: The procedure, risks, benefits, and alternatives were explained to the patient. Questions regarding the procedure were encouraged and answered. The patient understands and consents to the procedure.  Contrast was injected into both lower extremities for bilateral lower extremity venography.  Findings:  Left lower extremity venography demonstrates maintained patency throughout the left thigh and external iliac vein.  There has been an estimated 25% reduction of the thrombus in the left common iliac vein extending into the IVC.  Nearly occlusive thrombus in the IVC persists.  Right lower extremity venography demonstrates near complete resolution of thrombus in the right femoral venous system.  There is some improvement within the occlusive thrombus in the right common and external iliac veins.  Approximately 10% thrombus reduction has occurred.  There is persistent nearly occlusive thrombus within the IVC trapped within the IVC filter.  Complications: None.  IMPRESSION: After 20 hours of lysis, there has been significant improvement. Lysis for additional 24 hours will be performed.   Original Report Authenticated By: Jolaine Click, M.D.       A/P: 62 y.o. male  With prior history of DVT and PE requiring IVC filter,in 6 months of coumadinization, needed again with DVT,  Requiring thrombolysis by [facility]. Work requested to see the patient, the fluid drop in his platelet count, from 132 on admission 73 today,Smear has been ordered for review.  HIT panel pending. Consider taking DIC panel.we need soon as possible records from [facility] especially labs drawn they are, and any hematologic workup that the patient may have had Dr. Arbutus Ped    is to see the patient following this consult with recommendations regarding diagnosis and  further  workup studies. An addendum to this note is to be written.    Thank you for the referral.  Elwyn Reach 12/27/2012 3:47 PM  Hematology/oncology Attending: The patient is seen and examined. I agree with the above note. He has a history of recurrent deep venous thrombosis. First event was 3-1/2 years ago following his surgical procedure with lithotripsy for kidney stones. The patient was treated with Coumadin for 7 months. He also had IVC filter placed after a  cholecystectomy that was complicated by hemorrhage. He was recently admitted with bilateral lower extremity swelling that was getting worse. Lower extremity venous Duplex at the emergency department showed findings consistent with acute deep venous thrombosis involving the right common femoral vein, right profunda femoris vein, right femoral vein, right popliteal vein, right posterior tibial vein and right peroneal vein. CT of the abdomen and pelvis showed Slight soft tissue stranding around the distal inferior vena cava with distention of the distal IVC and iliac veins and femoral vein suggesting acute thrombosis below the IVC filter.on 12/26/2012 the patient underwent venographic with thrombolytic therapy by interventional radiology. He is feeling much better with improvement in the swelling in the right lower extremity.his platelets count on admission were 132,000 but now down to 73,000.  I was consulted today to evaluate the patient's recurrent deep venous thrombosis as well as the thrombocytopenia. He is feeling fine today. He denied having any significant chest pain, shortness of breath, cough or hemoptysis. He has no bleeding issues. I have a lengthy discussion with the patient about his condition. I would recommend ordering a hypercoagulable panel to evaluate his recurrent deep venous thrombosis, with the understanding that the protein S and protein C levels would not be a reliable as expected to be low with the patient on anticoagulation. 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 I agree with ordering a HIT panel and considering switching the patient to a different anticoagulant. Xarelto may be a good choice for this patient at this point.  Because of the recurrent deep venous thrombosis, this patient will need to be on lifelong anticoagulation. My preference is to keep him on Xarelto if tolerated and  affordable. Otherwise Coumadin will be the second alternative.   Thank you so much for allowing me to participate in the care of Mr. Stutz, I will continue to monitor the patient with you and assist in his management on as-needed basis.

## 2012-12-27 NOTE — Progress Notes (Signed)
Subjective: Pt ok. C/o pain at catheter sites only behind legs.  Objective: Physical Exam: BP 91/73  Pulse 67  Temp(Src) 99.2 F (37.3 C) (Oral)  Resp 18  Ht 5\' 8"  (1.727 m)  Wt 172 lb 8 oz (78.245 kg)  BMI 26.23 kg/m2  SpO2 96% (R)LE soft, trace edema. Catheter intact, site clean, soft. (L)LE still edematous and tight from thigh to ankle. Catheter intact, site soft, small amount blood oozing on dressing.   Labs: CBC  Recent Labs  2013/01/22 1612 12/27/12 0500  WBC 5.4 5.4  HGB 10.4* 9.6*  HCT 29.8* 26.9*  PLT 79* 73*   BMET  Recent Labs  12/25/12 0550  NA 133*  K 3.5  CL 98  CO2 21  GLUCOSE 131*  BUN 20  CREATININE 1.17  CALCIUM 9.5   LFT  Recent Labs  12/25/12 0550 12/25/12 0731  PROT 7.1  --   ALBUMIN 3.8  --   AST 15  --   ALT 12  --   ALKPHOS 81  --   BILITOT 2.8*  --   LIPASE  --  44   PT/INR No results found for this basename: LABPROT, INR,  in the last 72 hours   Studies/Results: Ir Veno/ext/bi  01-22-13  *RADIOLOGY REPORT*  Clinical Data/Indication: PLEGMASIA OF THE RIGHT LOWER EXTREMITY. MASSIVE RIGHT LOWER EXTREMITY DVT.  LEFT COMMON ILIAC VEIN DVT.  RADIOLOGY EXAMINATION,IR ULTRASOUND GUIDANCE VASC ACCESS LEFT,IR ULTRASOUND GUIDANCE VASC ACCESS RIGHT,BILATERAL EXTREMITY VENOGRAPHY  Sedation: Versed zero mg, Fentanyl 100 mg.  Total Moderate Sedation Time: 60 minutes.  Contrast Volume: 60 ml Omnipaque-300.  Additional Medications: Morphine 2 mg.  Fluoroscopy Time: 5.7 minutes.  Procedure: The procedure, risks, benefits, and alternatives were explained to the patient. Questions regarding the procedure were encouraged and answered. The patient understands and consents to the procedure.  The bilateral popliteal fossas in the prone position were prepped with betadine in a sterile fashion, and a sterile drape was applied covering the operative field. A sterile gown and sterile gloves were used for the procedure.  Under sonographic guidance, a  micropuncture needle was inserted into the left popliteal vein and removed over a 018 wire which was upsized to a [place].  A 7-French sheath was inserted.  The identical procedure was performed in the right popliteal fossa.  Bilateral lower extremity venography was performed.  This confirmed diffuse DVT in the right lower extremity and occlusive thrombus in the left common iliac vein.  A vertebral catheter was then advanced over a Benson wire across the left common iliac vein occlusion, into the IVC, and above the IVC filter.  It was removed over an exchange length Amplatz wire. The identical procedure was performed from the right popliteal vein.  90 cm length infusion catheters were then inserted.  A 20-cm infusion length was placed across the left common iliac vein occlusion.  A 50 cm infusion length catheter was placed from the right femoral vein to the IVC.  TNK was then instituted at 0.25 mg per hour per catheter.  Peripheral heparin was instituted.  Sheaths were sewn in place and capped open with a sterile normal saline K A V L.  Findings: Left lower extremity venography demonstrates occlusive DVT in the left common iliac veins and IVC to the level of the IVC filter.  Right lower extremity venography demonstrates massive DVT throughout the right femoral vein, popliteal vein, common femoral vein, and iliac venous system.  Complications: None.  IMPRESSION: Venography confirms bilateral DVT.  Infusion was instituted via bilateral popliteal fossa approach with a total of 0.5 mg TNK per hour.   Original Report Authenticated By: Jolaine Click, M.D.    Ir US Guide Vasc Access Left  12/26/2012  *RADIOLOGY REPORT*  Clinical Data/Indication: PLEGMASIA OF THE RIGHT LOWER EXTREMITY. MASSIVE RIGHT LOWER EXTREMITY DVT.  LEFT COMMON ILIAC VEIN DVT.  RADIOLOGY EXAMINATION,IR ULTRASOUND GUIDANCE VASC ACCESS LEFT,IR ULTRASOUND GUIDANCE VASC ACCESS RIGHT,BILATERAL EXTREMITY VENOGRAPHY  Sedation: Versed zero mg, Fentanyl 100 mg.   Total Moderate Sedation Time: 60 minutes.  Contrast Volume: 60 ml Omnipaque-300.  Additional Medications: Morphine 2 mg.  Fluoroscopy Time: 5.7 minutes.  Procedure: The procedure, risks, benefits, and alternatives were explained to the patient. Questions regarding the procedure were encouraged and answered. The patient understands and consents to the procedure.  The bilateral popliteal fossas in the prone position were prepped with betadine in a sterile fashion, and a sterile drape was applied covering the operative field. A sterile gown and sterile gloves were used for the procedure.  Under sonographic guidance, a micropuncture needle was inserted into the left popliteal vein and removed over a 018 wire which was upsized to a [place].  A 7-French sheath was inserted.  The identical procedure was performed in the right popliteal fossa.  Bilateral lower extremity venography was performed.  This confirmed diffuse DVT in the right lower extremity and occlusive thrombus in the left common iliac vein.  A vertebral catheter was then advanced over a Benson wire across the left common iliac vein occlusion, into the IVC, and above the IVC filter.  It was removed over an exchange length Amplatz wire. The identical procedure was performed from the right popliteal vein.  90 cm length infusion catheters were then inserted.  A 20-cm infusion length was placed across the left common iliac vein occlusion.  A 50 cm infusion length catheter was placed from the right femoral vein to the IVC.  TNK was then instituted at 0.25 mg per hour per catheter.  Peripheral heparin was instituted.  Sheaths were sewn in place and capped open with a sterile normal saline K A V L.  Findings: Left lower extremity venography demonstrates occlusive DVT in the left common iliac veins and IVC to the level of the IVC filter.  Right lower extremity venography demonstrates massive DVT throughout the right femoral vein, popliteal vein, common femoral vein,  and iliac venous system.  Complications: None.  IMPRESSION: Venography confirms bilateral DVT.  Infusion was instituted via bilateral popliteal fossa approach with a total of 0.5 mg TNK per hour.   Original Report Authenticated By: Jolaine Click, M.D.    Ir US Guide Vasc Access Right  12/26/2012  *RADIOLOGY REPORT*  Clinical Data/Indication: PLEGMASIA OF THE RIGHT LOWER EXTREMITY. MASSIVE RIGHT LOWER EXTREMITY DVT.  LEFT COMMON ILIAC VEIN DVT.  RADIOLOGY EXAMINATION,IR ULTRASOUND GUIDANCE VASC ACCESS LEFT,IR ULTRASOUND GUIDANCE VASC ACCESS RIGHT,BILATERAL EXTREMITY VENOGRAPHY  Sedation: Versed zero mg, Fentanyl 100 mg.  Total Moderate Sedation Time: 60 minutes.  Contrast Volume: 60 ml Omnipaque-300.  Additional Medications: Morphine 2 mg.  Fluoroscopy Time: 5.7 minutes.  Procedure: The procedure, risks, benefits, and alternatives were explained to the patient. Questions regarding the procedure were encouraged and answered. The patient understands and consents to the procedure.  The bilateral popliteal fossas in the prone position were prepped with betadine in a sterile fashion, and a sterile drape was applied covering the operative field. A sterile gown and sterile gloves were used for the procedure.  Under sonographic guidance,  a micropuncture needle was inserted into the left popliteal vein and removed over a 018 wire which was upsized to a [place].  A 7-French sheath was inserted.  The identical procedure was performed in the right popliteal fossa.  Bilateral lower extremity venography was performed.  This confirmed diffuse DVT in the right lower extremity and occlusive thrombus in the left common iliac vein.  A vertebral catheter was then advanced over a Benson wire across the left common iliac vein occlusion, into the IVC, and above the IVC filter.  It was removed over an exchange length Amplatz wire. The identical procedure was performed from the right popliteal vein.  90 cm length infusion catheters were then  inserted.  A 20-cm infusion length was placed across the left common iliac vein occlusion.  A 50 cm infusion length catheter was placed from the right femoral vein to the IVC.  TNK was then instituted at 0.25 mg per hour per catheter.  Peripheral heparin was instituted.  Sheaths were sewn in place and capped open with a sterile normal saline K A V L.  Findings: Left lower extremity venography demonstrates occlusive DVT in the left common iliac veins and IVC to the level of the IVC filter.  Right lower extremity venography demonstrates massive DVT throughout the right femoral vein, popliteal vein, common femoral vein, and iliac venous system.  Complications: None.  IMPRESSION: Venography confirms bilateral DVT.  Infusion was instituted via bilateral popliteal fossa approach with a total of 0.5 mg TNK per hour.   Original Report Authenticated By: Jolaine Click, M.D.    Ir Infusion Thrombol Venous Initial (ms)  12/26/2012  *RADIOLOGY REPORT*  Clinical Data/Indication: PLEGMASIA OF THE RIGHT LOWER EXTREMITY. MASSIVE RIGHT LOWER EXTREMITY DVT.  LEFT COMMON ILIAC VEIN DVT.  RADIOLOGY EXAMINATION,IR ULTRASOUND GUIDANCE VASC ACCESS LEFT,IR ULTRASOUND GUIDANCE VASC ACCESS RIGHT,BILATERAL EXTREMITY VENOGRAPHY  Sedation: Versed zero mg, Fentanyl 100 mg.  Total Moderate Sedation Time: 60 minutes.  Contrast Volume: 60 ml Omnipaque-300.  Additional Medications: Morphine 2 mg.  Fluoroscopy Time: 5.7 minutes.  Procedure: The procedure, risks, benefits, and alternatives were explained to the patient. Questions regarding the procedure were encouraged and answered. The patient understands and consents to the procedure.  The bilateral popliteal fossas in the prone position were prepped with betadine in a sterile fashion, and a sterile drape was applied covering the operative field. A sterile gown and sterile gloves were used for the procedure.  Under sonographic guidance, a micropuncture needle was inserted into the left popliteal  vein and removed over a 018 wire which was upsized to a [place].  A 7-French sheath was inserted.  The identical procedure was performed in the right popliteal fossa.  Bilateral lower extremity venography was performed.  This confirmed diffuse DVT in the right lower extremity and occlusive thrombus in the left common iliac vein.  A vertebral catheter was then advanced over a Benson wire across the left common iliac vein occlusion, into the IVC, and above the IVC filter.  It was removed over an exchange length Amplatz wire. The identical procedure was performed from the right popliteal vein.  90 cm length infusion catheters were then inserted.  A 20-cm infusion length was placed across the left common iliac vein occlusion.  A 50 cm infusion length catheter was placed from the right femoral vein to the IVC.  TNK was then instituted at 0.25 mg per hour per catheter.  Peripheral heparin was instituted.  Sheaths were sewn in place and capped open with  a sterile normal saline K A V L.  Findings: Left lower extremity venography demonstrates occlusive DVT in the left common iliac veins and IVC to the level of the IVC filter.  Right lower extremity venography demonstrates massive DVT throughout the right femoral vein, popliteal vein, common femoral vein, and iliac venous system.  Complications: None.  IMPRESSION: Venography confirms bilateral DVT.  Infusion was instituted via bilateral popliteal fossa approach with a total of 0.5 mg TNK per hour.   Original Report Authenticated By: Jolaine Click, M.D.    Ir Rande Lawman F/u Eval Art/ven Basil Dess Day (ms)  12/26/2012  *RADIOLOGY REPORT*  Clinical Data/Indication: MASSIVE LOWER EXTREMITY DVT  BILATERAL LOWER EXTREMITY VENOUS LYSIS CHECK POST 20 HOURS.  Fluoroscopy Time: 1.0 minutes.  Procedure: The procedure, risks, benefits, and alternatives were explained to the patient. Questions regarding the procedure were encouraged and answered. The patient understands and consents to the  procedure.  Contrast was injected into both lower extremities for bilateral lower extremity venography.  Findings: Left lower extremity venography demonstrates maintained patency throughout the left thigh and external iliac vein.  There has been an estimated 25% reduction of the thrombus in the left common iliac vein extending into the IVC.  Nearly occlusive thrombus in the IVC persists.  Right lower extremity venography demonstrates near complete resolution of thrombus in the right femoral venous system.  There is some improvement within the occlusive thrombus in the right common and external iliac veins.  Approximately 10% thrombus reduction has occurred.  There is persistent nearly occlusive thrombus within the IVC trapped within the IVC filter.  Complications: None.  IMPRESSION: After 20 hours of lysis, there has been significant improvement. Lysis for additional 24 hours will be performed.   Original Report Authenticated By: Jolaine Click, M.D.     Assessment/Plan: Extensive DVT from IVC filter distal to (B)LE. S/p (B)popliteal vein access and thrombolysis initiated 3/15, repeat venogram yesterday showed some improvement R>L. Fibrinogen levels lower last 2 checks. (159 and 141) PLT declining, HIT panel ordered For repeat Venogram today.    LOS: 2 days    Brayton El PA-C 12/27/2012 10:06 AM

## 2012-12-27 NOTE — Progress Notes (Signed)
ANTICOAGULATION CONSULT NOTE - Initial Consult  Pharmacy Consult for Heparin Indication: New DVT  No Known Allergies  Patient Measurements: Height: 5\' 8"  (172.7 cm) Weight: 172 lb 8 oz (78.245 kg) IBW/kg (Calculated) : 63.9  Vital Signs: Temp: 99.5 F (37.5 C) (03/17 0000) Temp src: Oral (03/17 0000) BP: 112/59 mmHg (03/17 0000) Pulse Rate: 74 (03/17 0000)  Labs:  Recent Labs  12/25/12 0550 12/25/12 1332 12/25/12 1800  12/26/12 0710 12/26/12 1612 12/26/12 2315  HGB 12.7*  --  11.8*  --  10.9* 10.4*  --   HCT 36.7*  --  33.8*  --  32.1* 29.8*  --   PLT 132*  --  113*  --  89* 79*  --   APTT 26  --   --   --   --   --   --   HEPARINUNFRC  --   --  0.74*  < > 0.43 0.15* <0.10*  CREATININE 1.17  --   --   --   --   --   --   CKTOTAL  --  57  --   --   --   --   --   < > = values in this interval not displayed.  Estimated Creatinine Clearance: 62.5 ml/min (by C-G formula based on Cr of 1.17).   Medical History: Past Medical History  Diagnosis Date  . Kidney stones   . Pulmonary embolus   . S/P IVC filter     Assessment: 13 yom with h/o PE, s/p IVC filter presented 3/15 with c/o back pain radiating down to R leg/knee.  Patient was not anticoagulated PTA. Preliminary RLE venous duplex positive for extensive, nonocclusive, acute deep vein thrombosis involving the right saphenofemoral junction, common femoral, femoral, popliteal, posterior tibial, and peroneal veins.  MD initially ordered for Lovenox dosing for VTE treatment.   Patient received one dose of Lovenox 80mg  on 3/15 at 10am, then changed to IV heparin in anticipation of vascular procedure - BLE venous lysis  3/15 pm, MD ordered Tenecteplase @ 0.25mg /hr via bilateral LE popliteal vein access, in addition to IV heparin   Pltc decreased to 89k (3/16)- monitor carefully for bleeding (per RN Darl Pikes), pt went to IR today for dressing changes and is bleeding from sites - will continue to monitor)  Heparin running  at 600 units/hr and heparin level now undetectable (< 0.1) despite last rate increase.  Spoke with the RN who verified no interruption in IV heparin therapy nor problems with the IV site  Tenecteplase continues therefore will just increase heparin slightly as heparin level was supratherapeutic on near dose  Goal of Therapy:  Heparin Level = 0.2-0.5 while Tenecteplase infusing Monitor platelets by anticoagulation protocol: Yes   Plan:   Increase heparin to 700 units/hr  Check heparin level in 6 hr to verify therapeutic  Tenecteplase continues to infuse, therefore, continue to maintain lower goal of 0.2-0.5  Continue to follow daily heparin level, CBC  Follow up plans for oral anticoagulation  Terrilee Files, PharmD 12/27/12  1:11 AM

## 2012-12-27 NOTE — Progress Notes (Addendum)
ANTICOAGULATION CONSULT NOTE - F/U Consult  Pharmacy Consult for Lovenox/warfarin Indication: extensive DVT  No Known Allergies  Patient Measurements: Height: 5\' 8"  (172.7 cm) Weight: 172 lb 8 oz (78.245 kg) IBW/kg (Calculated) : 63.9  Vital Signs: Temp: 99.2 F (37.3 C) (03/17 0800) Temp src: Oral (03/17 0800) BP: 114/71 mmHg (03/17 1446) Pulse Rate: 80 (03/17 1446)  Labs:  Recent Labs  12/25/12 0550 12/25/12 1332  12/26/12 0710 12/26/12 1612 12/26/12 2315 12/27/12 0500 12/27/12 0825  HGB 12.7*  --   < > 10.9* 10.4*  --  9.6*  --   HCT 36.7*  --   < > 32.1* 29.8*  --  26.9*  --   PLT 132*  --   < > 89* 79*  --  73*  --   APTT 26  --   --   --   --   --   --   --   HEPARINUNFRC  --   --   < > 0.43 0.15* <0.10*  --  <0.10*  CREATININE 1.17  --   --   --   --   --   --   --   CKTOTAL  --  57  --   --   --   --   --   --   < > = values in this interval not displayed.  Estimated Creatinine Clearance: 62.5 ml/min (by C-G formula based on Cr of 1.17).   Medical History: Past Medical History  Diagnosis Date  . Kidney stones   . Pulmonary embolus   . S/P IVC filter     Assessment: 38 yom with h/o PE, s/p IVC filter presented 3/15 with c/o back pain radiating down to R leg/knee.  Patient was not anticoagulated PTA. Preliminary RLE venous duplex positive for extensive, nonocclusive, acute deep vein thrombosis involving the right saphenofemoral junction, common femoral, femoral, popliteal, posterior tibial, and peroneal veins.  MD initially ordered for Lovenox dosing for VTE treatment, but was transitioned to IV Heparin on 3/15.  3/15 pm, MD ordered Tenecteplase @ 0.25mg /hr via bilateral LE popliteal vein access, in addition to IV heparin   3/17 per IR, catheter-directed lytic therapy was successful so tenecteplase discontinued.  Post procedure, patient will be transitioned from IV heparin to Lovenox + warfarin.  Plt (73) and Hgb (9.6) continue to drift down.  Monitor  carefully for bleeding.  HIT panel ordered 3/17 per MD.  Goal of Therapy:  4-hour LMWH level goal 0.6-1.2 Monitor platelets by anticoagulation protocol: Yes   Plan:  Plan to initiate treatment dose lovenox 4 hours post tenecteplase discontinuation by IR.  Tenecteplase was discontinued ~1500 so will begin Lovenox 80 mg SQ q12h at 1900.  IV heparin currently running at 850 units/hr.  Discussed with RN to discontinue IV heparin at 1800, 1 hour prior to Lovenox dose. Start warfarin tonight with 7.5 mg once at 1800. Provide patient with warfarin booklet and video.  Pharmacist education to follow. Continue to monitor H&H and platelets, HIT panel when available.  Clance Boll, PharmD, BCPS Pager: [phone] 12/27/2012 4:02 PM

## 2012-12-27 NOTE — Progress Notes (Signed)
[facility] PROGRESS NOTE  Jonathan Ellison ZOX:[id number] DOB: 1950/11/27 DOA: 12/25/2012 PCP: No primary provider on file.  Brief narrative: Jonathan Ellison is an 62 y.o. male with a past medical history of DVT/PE status post IVC filter who was admitted on 12/25/2012 with phlegmasia cerulea dolens and extensive DVT. He has undergone thrombolysis per interventional radiology with improvement in his pain and lower extremity swelling.  Assessment/Plan: Principal Problem:   Phlegmasia cerulea dolens -Patient was seen by Dr. Edilia Bo of vascular surgery and Dr. Bonnielee Haff of interventional radiology and underwent thrombolysis emergently secondary to concerns for limb ischemia. -Subsequently placed on heparin. -F/U evaluation by IR showed near resolution of DVT in right femoral system and improved DVT in bilateral iliac veins and IVC. Active Problems:   Thrombocytopenia -Platelet count steadily dropping. Check HIT panel.   Normocytic anemia -No current indications for transfusion.  Likely from post-procedural blood loss.   Pain in limb -Analgesia as needed.  Code Status: Full  Family Communication: Fulton Mole (wife) [phone]; cell [phone].  Disposition Plan: Home when stable  Medical Consultants:  Dr. Maryclare Bean, Interventional Radiology  Dr. Waverly Ferrari, Vascular Surgery  Other Consultants:  None.  Anti-infectives:  None.  HPI/Subjective: Jonathan Ellison says that his right leg feels much better. He is having some back discomfort from having to lay flat on his back and is hungry as he has not yet been allowed to take in more than clear liquids. Low-grade fever noted.  Objective: Filed Vitals:   12/27/12 0300 12/27/12 0400 12/27/12 0500 12/27/12 0600  BP: 116/51 102/56 104/52 106/56  Pulse: 86 80 75 73  Temp:  100 F (37.8 C)    TempSrc:  Oral    Resp: 21 21 18 19   Height:      Weight:      SpO2: 95% 94% 94% 95%    Intake/Output Summary (Last 24 hours) at 12/27/12  0715 Last data filed at 12/27/12 0600  Gross per 24 hour  Intake 2708.77 ml  Output   2050 ml  Net 658.77 ml    Exam: Gen:  NAD Cardiovascular:  RRR, No M/R/G Respiratory:  Lungs CTAB Gastrointestinal:  Abdomen soft, NT/ND, + BS Extremities:  RLE softer, normal color, 2+ pedal pulse.  Data Reviewed: Basic Metabolic Panel:  Recent Labs Lab 12/25/12 0550  NA 133*  K 3.5  CL 98  CO2 21  GLUCOSE 131*  BUN 20  CREATININE 1.17  CALCIUM 9.5   GFR Estimated Creatinine Clearance: 62.5 ml/min (by C-G formula based on Cr of 1.17). Liver Function Tests:  Recent Labs Lab 12/25/12 0550  AST 15  ALT 12  ALKPHOS 81  BILITOT 2.8*  PROT 7.1  ALBUMIN 3.8    Recent Labs Lab 12/25/12 0731  LIPASE 44    CBC:  Recent Labs Lab 12/25/12 0550 12/25/12 1800 12/26/12 0710 12/26/12 1612 12/27/12 0500  WBC 7.8 7.1 5.4 5.4 5.4  NEUTROABS 5.6  --   --   --   --   HGB 12.7* 11.8* 10.9* 10.4* 9.6*  HCT 36.7* 33.8* 32.1* 29.8* 26.9*  MCV 89.3 90.6 91.2 90.0 89.4  PLT 132* 113* 89* 79* 73*   Cardiac Enzymes:  Recent Labs Lab 12/25/12 1332  CKTOTAL 57   Microbiology Recent Results (from the past 240 hour(s))  MRSA PCR SCREENING     Status: None   Collection Time    12/25/12  4:57 PM      Result Value Range Status  MRSA by PCR NEGATIVE  NEGATIVE Final   Comment:            The GeneXpert MRSA Assay (FDA     approved for NASAL specimens     only), is one component of a     comprehensive MRSA colonization     surveillance program. It is not     intended to diagnose MRSA     infection nor to guide or     monitor treatment for     MRSA infections.     Procedures and Diagnostic Studies: Dg Chest 2 View  12/25/2012  *RADIOLOGY REPORT*  Clinical Data: Increase shortness of breath with activity.  CHEST - 2 VIEW  Comparison: None.  Findings: Normal heart size and pulmonary vascularity.  Scattered calcified granulomas in the lungs with calcified left hilar lymph nodes.   No focal consolidation or airspace disease.  No blunting of costophrenic angles.  No pneumothorax.  Degenerative changes in the spine.  Surgical clips in the right upper quadrant.  Postoperative change in the right shoulder.  IMPRESSION: No evidence of active pulmonary disease.   Original Report Authenticated By: Burman Nieves, M.D.    Ct Abdomen Pelvis W Contrast  12/25/2012  *RADIOLOGY REPORT*  Clinical Data: Abdominal pain and back pain.  CT ABDOMEN AND PELVIS WITH CONTRAST  Technique:  Multidetector CT imaging of the abdomen and pelvis was performed following the standard protocol during bolus administration of intravenous contrast.  Contrast: 50mL OMNIPAQUE IOHEXOL 300 MG/ML  SOLN, OMNIPAQUE IOHEXOL 300 MG/ML  SOLN  Comparison: None.  Findings: The patient has an IVC filter in place.  The lower IVC and common iliac and femoral veins are distended and there is slight soft tissue stranding around the distal inferior vena cava. I suspect the patient may have acutely trapped a large thrombus in the inferior vena cava below the filter.  The liver, spleen, pancreas, adrenal glands, and kidneys demonstrate no significant abnormalities.  There is an 18 x 11 x 13 mm lesion in the anterior aspect of the left lobe the liver, probably a small hemangioma.  There is a small parapelvic cyst in the left kidney.  There are a few calcified granulomas in the spleen. Gallbladder has been removed.  There are numerous diverticula in the descending and proximal sigmoid portions of the colon.  No significant osseous abnormality.  IMPRESSION:  1. Slight soft tissue stranding around the distal inferior vena cava with distention of the distal IVC and iliac veins and femoral vein suggesting acute thrombosis below the IVC filter. 2. Diverticulosis.   Original Report Authenticated By: Francene Boyers, M.D.     Scheduled Meds: . sodium chloride  3 mL Intravenous Q12H   Continuous Infusions: . sodium chloride 20 mL/hr at  12/25/12 1615  . heparin 700 Units/hr (12/27/12 0230)  . tenecteplase (TNKase) infusion 0.25 mg/hr (12/26/12 1320)  . tenecteplase (TNKase) infusion 0.25 mg/hr (12/26/12 1316)    Time spent: 25 minutes.   LOS: 2 days   Yania Bogie  [facility] Pager [phone].  If 8PM-8AM, please contact night-coverage at [email or web address], password [facility] 12/27/2012, 7:15 AM

## 2012-12-28 DIAGNOSIS — I80299 Phlebitis and thrombophlebitis of other deep vessels of unspecified lower extremity: Secondary | ICD-10-CM

## 2012-12-28 DIAGNOSIS — I82409 Acute embolism and thrombosis of unspecified deep veins of unspecified lower extremity: Secondary | ICD-10-CM

## 2012-12-28 LAB — CBC
Hemoglobin: 7.4 g/dL — ABNORMAL LOW (ref 13.0–17.0)
MCH: 31.5 pg (ref 26.0–34.0)
Platelets: 93 10*3/uL — ABNORMAL LOW (ref 150–400)
RBC: 2.35 MIL/uL — ABNORMAL LOW (ref 4.22–5.81)

## 2012-12-28 LAB — BASIC METABOLIC PANEL
CO2: 24 mEq/L (ref 19–32)
Calcium: 7.5 mg/dL — ABNORMAL LOW (ref 8.4–10.5)
Chloride: 103 mEq/L (ref 96–112)
Creatinine, Ser: 1.03 mg/dL (ref 0.50–1.35)
GFR calc Af Amer: 87 mL/min — ABNORMAL LOW (ref >90)
Sodium: 133 mEq/L — ABNORMAL LOW (ref 135–145)

## 2012-12-28 LAB — PROTIME-INR
INR: 1.13 (ref 0.00–1.49)
Prothrombin Time: 14.3 seconds (ref 11.6–15.2)

## 2012-12-28 LAB — FIBRINOGEN: Fibrinogen: 177 mg/dL — ABNORMAL LOW (ref 204–475)

## 2012-12-28 LAB — LUPUS ANTICOAGULANT PANEL

## 2012-12-28 LAB — BETA-2-GLYCOPROTEIN I ABS, IGG/M/A
Beta-2 Glyco I IgG: 9 G Units (ref <20)
Beta-2-Glycoprotein I IgA: 7 A Units (ref <20)
Beta-2-Glycoprotein I IgM: 4 M Units (ref <20)

## 2012-12-28 MED ORDER — WARFARIN SODIUM 7.5 MG PO TABS
7.5000 mg | ORAL_TABLET | Freq: Once | ORAL | Status: AC
  Administered 2012-12-28: 7.5 mg via ORAL
  Filled 2012-12-28: qty 1

## 2012-12-28 NOTE — Progress Notes (Signed)
ANTICOAGULATION CONSULT NOTE - F/U Consult  Pharmacy Consult for Lovenox/warfarin Indication: extensive DVT  No Known Allergies  Patient Measurements: Height: 5\' 8"  (172.7 cm) Weight: 172 lb 8 oz (78.245 kg) IBW/kg (Calculated) : 63.9  Vital Signs: Temp: 98 F (36.7 C) (03/18 0800) Temp src: Oral (03/18 0800) BP: 111/51 mmHg (03/18 0900)  Labs:  Recent Labs  12/25/12 1332  12/26/12 1612 12/26/12 2315 12/27/12 0500 12/27/12 0825 12/28/12 0405  HGB  --   < > 10.4*  --  9.6*  --  7.4*  HCT  --   < > 29.8*  --  26.9*  --  20.5*  PLT  --   < > 79*  --  73*  --  93*  LABPROT  --   --   --   --   --   --  14.3  INR  --   --   --   --   --   --  1.13  HEPARINUNFRC  --   < > 0.15* <0.10*  --  <0.10*  --   CREATININE  --   --   --   --   --   --  1.03  CKTOTAL 57  --   --   --   --   --   --   < > = values in this interval not displayed.  Estimated Creatinine Clearance: 71 ml/min (by C-G formula based on Cr of 1.03).   Medical History: Past Medical History  Diagnosis Date  . Kidney stones   . Pulmonary embolus   . S/P IVC filter     Assessment: 42 yom with h/o PE, s/p IVC filter (no anticoag) presented 3/15 with extensive, nonocclusive, acute deep vein thrombosis involving the right saphenofemoral junction, common femoral, femoral, popliteal, posterior tibial, and peroneal veins.    MD initially ordered for Lovenox dosing for VTE treatment, but was transitioned to IV Heparin on 3/15.  3/15 pm, MD ordered Tenecteplase @ 0.25mg /hr via bilateral LE popliteal vein access, in addition to IV heparin.  3/17 per IR, catheter-directed lytic therapy was successful so tenecteplase discontinued.    Post procedure, patient was transitioned from IV heparin to Lovenox + warfarin on 3/17  INR (1.13) remains subtherapeutic as expected after only first warfarin dose.  SCr remains stable, wnl.  CrCl ~ 71 ml/min.  Hgb (7.4) with large decrease.  Plt (93) are slightly improved.   Monitor carefully for bleeding.  HIT panel ordered 3/17 per MD.  Goal of Therapy:  4-hour LMWH level goal 0.6-1.2 INR 2-3 Monitor platelets by anticoagulation protocol: Yes   Plan:  Continue Lovenox 80mg  SQ q12h Repeat warfarin 7.5 mg once at 1800. Patient provided warfarin booklet and video.  Pharmacist education to follow. Continue to monitor H&H and platelets, HIT panel when available.  Lynann Beaver PharmD, BCPS Pager [phone] 12/28/2012 10:53 AM

## 2012-12-28 NOTE — Progress Notes (Signed)
Subjective: Pt states rt leg is feeling better but left leg more swollen since completion of thrombolysis  Objective: Vital signs in last 24 hours: Temp:  [97.5 F (36.4 C)-98.7 F (37.1 C)] 97.9 F (36.6 C) (03/18 1327) Pulse Rate:  [74-90] 84 (03/18 1327) Resp:  [17-23] 20 (03/18 1327) BP: (88-115)/(43-88) 115/88 mmHg (03/18 1327) SpO2:  [95 %-100 %] 97 % (03/18 1327) Last BM Date: 12/27/12  Intake/Output from previous day: 03/17 0701 - 03/18 0700 In: 626.5 [I.V.:326.5; IV Piggyback:300] Out: 800 [Urine:800] Intake/Output this shift: Total I/O In: 240 [P.O.:240] Out: 450 [Urine:450]  Right leg with trace edema, puncture site pop space with minimal ecchymoses, good motor/sens fxn; left leg with increased edema, primarily from foot to knee; mod amt of ecchymoses noted at popliteal access site, tender to palpation; sens/motor fxn intact, but difficult to flex left leg due to edema  Lab Results:   Recent Labs  12/27/12 0500 12/28/12 0405  WBC 5.4 4.8  HGB 9.6* 7.4*  HCT 26.9* 20.5*  PLT 73* 93*   BMET  Recent Labs  12/28/12 0405  NA 133*  K 3.5  CL 103  CO2 24  GLUCOSE 122*  BUN 14  CREATININE 1.03  CALCIUM 7.5*   PT/INR  Recent Labs  12/28/12 0405  LABPROT 14.3  INR 1.13   ABG No results found for this basename: PHART, PCO2, PO2, HCO3,  in the last 72 hours  Studies/Results: Ir Rande Lawman F/u Eval Art/ven Final Day (ms)  12/27/2012  *RADIOLOGY REPORT*  Indication: History of bilateral lower extremity occlusive DVT with extension to the IVC.  History of IVC filter placement (at least 2 years ago) at outside facility.  BILATERAL LOWER EXTREMITY VENOUS LYSIS CHECK - FINAL DAY  Comparisons: Bilateral lower extremity venous lysis check dash 12/26/2012; ultrasound and fluoroscopic guided infusion catheter placement - 12/25/2012  Intravenous Medications: None  Contrast: 70 ml Omnipaque-300  Fluoroscopy Time: 1.7 minutes.  Complications: None immediate   Technique:  Informed written consent was obtained from the patient after a discussion of the risks, benefits and alternatives to treatment. Questions regarding the procedure were encouraged and answered.  A timeout was performed prior to the initiation of the procedure.  The patient was placed prone on the fluoroscopy table and the external portion of the existing bilateral popliteal approach vascular sheathes and infusion catheters as well as the surrounding skin were prepped and draped in the usual sterile fashion, and a sterile drape was applied covering the operative field.  Maximum barrier sterile technique with sterile gowns and gloves were used for the procedure.  Initial venogram was performed through the left lower extremity approach infusion catheter.  Subsequent left lower extremity venograms were performed through the side arm of the existing left popliteal vein vascular sheath.  Right lower extremity venograms were performed at multiple stations via the side arm of the existing contralateral right lower extremity popliteal vein approach vascular sheath.  Given suboptimal opacification of the right-sided pelvic vasculature, the right lower extremity approach infusion catheter was exchanged for a vertebral catheter over a guidewire.  The vertebral catheter was positioned at the level of the peripheral aspect of the right external iliac vein.  A repeat right lower extremity central venogram was performed from this more central location.  Images were reviewed and the lysis was completed.  All catheters and sheaths were removed and hemostasis was achieved at the bilateral popliteal access sitees with manual compression. Dressings were placed.  The patient tolerated procedure well  without immediate postprocedural complication.  Findings:  Left lower extremity: The left common and external iliac veins are widely patent without evidence of stenosis.  There is a very minimal amount of nonocclusive DVT within the  distal aspect of the left superficial femoral vein a component of which is likely associated with the popliteal vein approach vascular sheath and infusion catheter.  Slightly sluggish flow is noted through the left lower extremity, however there is restored antegrade flow without filling of venous collaterals.  Right lower extremity:  The right common and external iliac veins are widely patent without evidence of stenosis.  There is a minimal amount of likely chronic nonocclusive DVT within the distal aspect of the right superficial femoral vein regional to the abductor canal.  There is minimal filling of several small venous collaterals at this location.  The remainder of the right superficial femoral vein is widely patent to the level of the pelvis.   Slightly sluggish antegrade flow is noted throughout the right lower extremity.  IVC: There is a minimal amount of residual nonocclusive thrombus within the infrarenal IVC filter.  The IVC is otherwise patent with restoration of antegrade flow.  Impression:  1.  Technically successful bilateral lower extremity venous lysis with restoration of antegrade flow throughout the bilateral lower extremities and through the IVC. 2.  Minimal amount of nonocclusive DVT remains within the IVC filter. 3.  Minimal amount of nonocclusive DVT within the distal aspects of the bilateral superficial femoral veins, the right side of which is felt to likely be chronic in etiology.  Plan:  Given concern for patient possibly developing HIT, the patient will be transitioned from heparin drip to Lovenox injections.  Referral will be made to Hematology for coagulopathy workup and management of anticoagulation.  In the meantime, the patient will be started on Coumadin.  The patient will be fitted with stockings prior to discharge.  The patient will followup with Dr. Bonnielee Haff at [facility]al [facility]ology Clinic [phone]) within 2 to 4 weeks after discharge.  Above findings discussed with Dr. Darnelle Catalan  at the time of procedure completion.   Original Report Authenticated By: Tacey Ruiz, MD     Anti-infectives: Anti-infectives   None      Assessment/Plan: s/p thrombolytic therapy to bilat LE/IVC DVT , completed 3/18; recommend ace wrap to left LE and increased ambulation if tolerable without sig pain; when in bed, pt should have left leg elevated; once ecchymoses and tenderness improves, rec thigh high ted hose; if swelling persists despite these measures can repeat venous doppler to r/o residual clot but feel that current edema is secondary to associated bruising/hematoma from pop access site.  LOS: 3 days    Yesena Reaves,D [facility] 12/28/2012

## 2012-12-28 NOTE — Progress Notes (Signed)
[facility] PROGRESS NOTE  Jonathan Ellison ZOX:[id number] DOB: Jan 27, 1951 DOA: 12/25/2012 PCP: No primary provider on file. No PCP per patient.  Brief narrative: Jonathan Ellison is an 62 y.o. male with a past medical history of DVT/PE status post IVC filter who was admitted on 12/25/2012 with phlegmasia cerulea dolens and extensive DVT. He has undergone thrombolysis per interventional radiology with improvement in his pain and lower extremity swelling. Dr. 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  Assessment/Plan: Principal Problem:   Phlegmasia cerulea dolens -Patient was seen by Dr. Edilia Bo of vascular surgery and Dr. Bonnielee Haff of interventional radiology and underwent thrombolysis emergently secondary to concerns for limb ischemia. -Subsequently placed on heparin, which was switched to Lovenox/Coumadin on 12/27/2012.Marland Kitchen -F/U evaluation by IR showed near resolution of DVT in right femoral system and improved DVT in bilateral iliac veins and IVC. -Hypercoagulable profile ordered with the exception of protein C and protein S. studies per Dr. Asa Lente recommendation. Preliminary studies show a normal homocysteine level and a low antithrombin III level. Lupus anticoagulant, beta-2 glycoprotein, factor V Leiden, and Cardiolite and antibodies are all pending. -Dr. Arbutus Ped also recommends treatment with Xarelto. We'll ask case manager to find out if his insurance will cover this. Active Problems:   Thrombocytopenia -Platelet count declined steadily until 12/27/2012. Platelet count this morning is slightly up. HIT panel sent 12/27/2012 with results pending.   Normocytic anemia -Likely from acute blood loss and recent procedure.  Monitor closely. Transfuse for hemoglobin less than 7.   Pain in limb -Analgesia as needed.  Code Status: Full  Family Communication: Fulton Mole (wife) [phone]; cell [phone]. No one at bedside this morning. Disposition Plan: Home when  stable  Medical Consultants:  Dr. Maryclare Bean, Interventional Radiology  Dr. Waverly Ferrari, Vascular Surgery  Dr. Si Gaul, Oncology  Other Consultants:  None.  Anti-infectives:  None.  HPI/Subjective: Jonathan Ellison now has swelling on the left lower extremity. He is extensive bruising behind his left leg. The pain has improved significantly. No dyspnea.  Objective: Filed Vitals:   12/28/12 0300 12/28/12 0400 12/28/12 0500 12/28/12 0600  BP: 89/43  88/46 92/54  Pulse:      Temp:      TempSrc:      Resp: 20 19 19 17   Height:      Weight:      SpO2:        Intake/Output Summary (Last 24 hours) at 12/28/12 0746 Last data filed at 12/27/12 2000  Gross per 24 hour  Intake  626.5 ml  Output    800 ml  Net -173.5 ml    Exam: Gen:  NAD Cardiovascular:  RRR, No M/R/G Respiratory:  Lungs CTAB Gastrointestinal:  Abdomen soft, NT/ND, + BS Extremities: Left lower extremity swollen compared to the right. Extensive ecchymosis behind the left leg. 2+ pedal pulses noted bilaterally.  Data Reviewed: Basic Metabolic Panel:  Recent Labs Lab 12/25/12 0550 12/28/12 0405  NA 133* 133*  K 3.5 3.5  CL 98 103  CO2 21 24  GLUCOSE 131* 122*  BUN 20 14  CREATININE 1.17 1.03  CALCIUM 9.5 7.5*   GFR Estimated Creatinine Clearance: 71 ml/min (by C-G formula based on Cr of 1.03). Liver Function Tests:  Recent Labs Lab 12/25/12 0550  AST 15  ALT 12  ALKPHOS 81  BILITOT 2.8*  PROT 7.1  ALBUMIN 3.8    Recent Labs Lab 12/25/12 0731  LIPASE 44    CBC:  Recent  Labs Lab 12/25/12 0550 12/25/12 1800 12/26/12 0710 12/26/12 1612 12/27/12 0500 12/28/12 0405  WBC 7.8 7.1 5.4 5.4 5.4 4.8  NEUTROABS 5.6  --   --   --   --   --   HGB 12.7* 11.8* 10.9* 10.4* 9.6* 7.4*  HCT 36.7* 33.8* 32.1* 29.8* 26.9* 20.5*  MCV 89.3 90.6 91.2 90.0 89.4 87.2  PLT 132* 113* 89* 79* 73* 93*   Cardiac Enzymes:  Recent Labs Lab 12/25/12 1332  CKTOTAL 57    Microbiology Recent Results (from the past 240 hour(s))  MRSA PCR SCREENING     Status: None   Collection Time    12/25/12  4:57 PM      Result Value Range Status   MRSA by PCR NEGATIVE  NEGATIVE Final   Comment:            The GeneXpert MRSA Assay (FDA     approved for NASAL specimens     only), is one component of a     comprehensive MRSA colonization     surveillance program. It is not     intended to diagnose MRSA     infection nor to guide or     monitor treatment for     MRSA infections.     Procedures and Diagnostic Studies: Dg Chest 2 View  12/25/2012  *RADIOLOGY REPORT*  Clinical Data: Increase shortness of breath with activity.  CHEST - 2 VIEW  Comparison: None.  Findings: Normal heart size and pulmonary vascularity.  Scattered calcified granulomas in the lungs with calcified left hilar lymph nodes.  No focal consolidation or airspace disease.  No blunting of costophrenic angles.  No pneumothorax.  Degenerative changes in the spine.  Surgical clips in the right upper quadrant.  Postoperative change in the right shoulder.  IMPRESSION: No evidence of active pulmonary disease.   Original Report Authenticated By: Burman Nieves, M.D.    Ct Abdomen Pelvis W Contrast  12/25/2012  *RADIOLOGY REPORT*  Clinical Data: Abdominal pain and back pain.  CT ABDOMEN AND PELVIS WITH CONTRAST  Technique:  Multidetector CT imaging of the abdomen and pelvis was performed following the standard protocol during bolus administration of intravenous contrast.  Contrast: 50mL OMNIPAQUE IOHEXOL 300 MG/ML  SOLN, OMNIPAQUE IOHEXOL 300 MG/ML  SOLN  Comparison: None.  Findings: The patient has an IVC filter in place.  The lower IVC and common iliac and femoral veins are distended and there is slight soft tissue stranding around the distal inferior vena cava. I suspect the patient may have acutely trapped a large thrombus in the inferior vena cava below the filter.  The liver, spleen, pancreas, adrenal  glands, and kidneys demonstrate no significant abnormalities.  There is an 18 x 11 x 13 mm lesion in the anterior aspect of the left lobe the liver, probably a small hemangioma.  There is a small parapelvic cyst in the left kidney.  There are a few calcified granulomas in the spleen. Gallbladder has been removed.  There are numerous diverticula in the descending and proximal sigmoid portions of the colon.  No significant osseous abnormality.  IMPRESSION:  1. Slight soft tissue stranding around the distal inferior vena cava with distention of the distal IVC and iliac veins and femoral vein suggesting acute thrombosis below the IVC filter. 2. Diverticulosis.   Original Report Authenticated By: Francene Boyers, M.D.     Scheduled Meds: . enoxaparin (LOVENOX) injection  80 mg Subcutaneous Q12H  . sodium chloride  3  mL Intravenous Q12H  . Warfarin - Pharmacist Dosing Inpatient   Does not apply q1800   Continuous Infusions: . sodium chloride 20 mL/hr at 12/25/12 1615    Time spent: 25 minutes.   LOS: 3 days   RAMA,CHRISTINA  [facility] Pager [phone].  If 8PM-8AM, please contact night-coverage at [email or web address], password [facility] 12/28/2012, 7:46 AM

## 2012-12-29 DIAGNOSIS — D696 Thrombocytopenia, unspecified: Secondary | ICD-10-CM

## 2012-12-29 DIAGNOSIS — D649 Anemia, unspecified: Secondary | ICD-10-CM

## 2012-12-29 LAB — PREPARE RBC (CROSSMATCH)

## 2012-12-29 LAB — HEPARIN INDUCED THROMBOCYTOPENIA PNL
UFH High Dose UFH H: 5 % Release
UFH Low Dose 0.1 IU/mL: 4 % Release
UFH Low Dose 0.5 IU/mL: 3 % Release
UFH SRA Result: NEGATIVE

## 2012-12-29 LAB — ABO/RH: ABO/RH(D): A NEG

## 2012-12-29 LAB — PROTIME-INR
INR: 1.25 (ref 0.00–1.49)
Prothrombin Time: 15.5 seconds — ABNORMAL HIGH (ref 11.6–15.2)

## 2012-12-29 LAB — CBC
Hemoglobin: 7 g/dL — ABNORMAL LOW (ref 13.0–17.0)
MCH: 32.3 pg (ref 26.0–34.0)
Platelets: 129 10*3/uL — ABNORMAL LOW (ref 150–400)
RBC: 2.17 MIL/uL — ABNORMAL LOW (ref 4.22–5.81)
WBC: 4.4 10*3/uL (ref 4.0–10.5)

## 2012-12-29 MED ORDER — RIVAROXABAN 15 MG PO TABS: 15.0000 mg | ORAL_TABLET | Freq: Two times a day (BID) | ORAL | Status: DC

## 2012-12-29 MED ORDER — POLYETHYLENE GLYCOL 3350 17 G PO PACK: 17.0000 g | PACK | Freq: Every day | ORAL | Status: DC | PRN

## 2012-12-29 MED ORDER — RIVAROXABAN 20 MG PO TABS: 20.0000 mg | ORAL_TABLET | Freq: Every day | ORAL | Status: DC

## 2012-12-29 MED ORDER — WARFARIN SODIUM 7.5 MG PO TABS
7.5000 mg | ORAL_TABLET | Freq: Once | ORAL | Status: AC
  Administered 2012-12-29: 7.5 mg via ORAL
  Filled 2012-12-29: qty 1

## 2012-12-29 MED ORDER — LORAZEPAM 1 MG PO TABS: 1.0000 mg | ORAL_TABLET | Freq: Three times a day (TID) | ORAL | Status: DC

## 2012-12-29 MED ORDER — OXYCODONE HCL 5 MG PO TABS: 5.0000 mg | ORAL_TABLET | ORAL | Status: DC | PRN

## 2012-12-29 MED ORDER — ONDANSETRON HCL 4 MG PO TABS: 4.0000 mg | ORAL_TABLET | Freq: Three times a day (TID) | ORAL | Status: DC | PRN

## 2012-12-29 MED ORDER — MEDICAL COMPRESSION STOCKINGS MISC: 1.0000 | Freq: Once | Status: DC

## 2012-12-29 MED ORDER — HYDROMORPHONE HCL 4 MG PO TABS: 4.0000 mg | ORAL_TABLET | ORAL | Status: DC | PRN

## 2012-12-29 NOTE — Progress Notes (Signed)
Discount Drug Coupon given to the pt for Xarelto,   SPOKE TO D [employer] RX COVERAGE IS THROUGH [employer] [employer] P#[phone] PT HAS A $200.00 DEDUCTABLE ON RX'S AND XARELTO IS TIER 3 DRUG AND HAS A COPAY OF $50.00 FOR 30DAY SUPPLY.

## 2012-12-29 NOTE — Progress Notes (Signed)
Patient given all discharge instructions, and instructed to follow up with PCP Dr. Larina Bras in week to recheck hemoglobin.  Patient verbalized an understanding of all discharge instructions, and will be transported home via spouse.  Philomena Doheny RN

## 2012-12-29 NOTE — Progress Notes (Signed)
Patient ID: Jonathan Ellison, male   DOB: 28-Apr-1951, 62 y.o.   MRN: [id number] Jonathan Ellison feels much better today. His only complaints are for L (initially ASx leg) leg pain. He is eating and wishes to return home.  RLE - No sign of edema or discoloration  LLE - 2+ edema. Positive popliteal hematoma. Warm/Pink.  Results for orders placed during the hospital encounter of 12/25/12  MRSA PCR SCREENING      Result Value Range   MRSA by PCR NEGATIVE  NEGATIVE  URINALYSIS, ROUTINE W REFLEX MICROSCOPIC      Result Value Range   Color, Urine YELLOW  YELLOW   APPearance CLEAR  CLEAR   Specific Gravity, Urine >1.046 (*) 1.005 - 1.030   pH 7.5  5.0 - 8.0   Glucose, UA NEGATIVE  NEGATIVE mg/dL   Hgb urine dipstick NEGATIVE  NEGATIVE   Bilirubin Urine NEGATIVE  NEGATIVE   Ketones, ur 40 (*) NEGATIVE mg/dL   Protein, ur NEGATIVE  NEGATIVE mg/dL   Urobilinogen, UA 1.0  0.0 - 1.0 mg/dL   Nitrite NEGATIVE  NEGATIVE   Leukocytes, UA NEGATIVE  NEGATIVE  CBC WITH DIFFERENTIAL      Result Value Range   WBC 7.8  4.0 - 10.5 K/uL   RBC 4.11 (*) 4.22 - 5.81 MIL/uL   Hemoglobin 12.7 (*) 13.0 - 17.0 g/dL   HCT 19.1 (*) 47.8 - 29.5 %   MCV 89.3  78.0 - 100.0 fL   MCH 30.9  26.0 - 34.0 pg   MCHC 34.6  30.0 - 36.0 g/dL   RDW 62.1  30.8 - 65.7 %   Platelets 132 (*) 150 - 400 K/uL   Neutrophils Relative 72  43 - 77 %   Neutro Abs 5.6  1.7 - 7.7 K/uL   Lymphocytes Relative 15  12 - 46 %   Lymphs Abs 1.2  0.7 - 4.0 K/uL   Monocytes Relative 10  3 - 12 %   Monocytes Absolute 0.8  0.1 - 1.0 K/uL   Eosinophils Relative 2  0 - 5 %   Eosinophils Absolute 0.1  0.0 - 0.7 K/uL   Basophils Relative 0  0 - 1 %   Basophils Absolute 0.0  0.0 - 0.1 K/uL  COMPREHENSIVE METABOLIC PANEL      Result Value Range   Sodium 133 (*) 135 - 145 mEq/L   Potassium 3.5  3.5 - 5.1 mEq/L   Chloride 98  96 - 112 mEq/L   CO2 21  19 - 32 mEq/L   Glucose, Bld 131 (*) 70 - 99 mg/dL   BUN 20  6 - 23 mg/dL   Creatinine, Ser 8.46   0.50 - 1.35 mg/dL   Calcium 9.5  8.4 - 96.2 mg/dL   Total Protein 7.1  6.0 - 8.3 g/dL   Albumin 3.8  3.5 - 5.2 g/dL   AST 15  0 - 37 U/L   ALT 12  0 - 53 U/L   Alkaline Phosphatase 81  39 - 117 U/L   Total Bilirubin 2.8 (*) 0.3 - 1.2 mg/dL   GFR calc non Af Amer 65 (*) >90 mL/min   GFR calc Af Amer 75 (*) >90 mL/min  APTT      Result Value Range   aPTT 26  24 - 37 seconds  LIPASE, BLOOD      Result Value Range   Lipase 44  11 - 59 U/L  LACTIC ACID, PLASMA  Result Value Range   Lactic Acid, Venous 3.3 (*) 0.5 - 2.2 mmol/L  CK      Result Value Range   Total CK 57  7 - 232 U/L  HEPARIN LEVEL (UNFRACTIONATED)      Result Value Range   Heparin Unfractionated 0.94 (*) 0.30 - 0.70 IU/mL  HEPARIN LEVEL (UNFRACTIONATED)      Result Value Range   Heparin Unfractionated 0.74 (*) 0.30 - 0.70 IU/mL  HEPARIN LEVEL (UNFRACTIONATED)      Result Value Range   Heparin Unfractionated 0.43  0.30 - 0.70 IU/mL  CBC      Result Value Range   WBC 7.1  4.0 - 10.5 K/uL   RBC 3.73 (*) 4.22 - 5.81 MIL/uL   Hemoglobin 11.8 (*) 13.0 - 17.0 g/dL   HCT 19.1 (*) 47.8 - 29.5 %   MCV 90.6  78.0 - 100.0 fL   MCH 31.6  26.0 - 34.0 pg   MCHC 34.9  30.0 - 36.0 g/dL   RDW 62.1  30.8 - 65.7 %   Platelets 113 (*) 150 - 400 K/uL  FIBRINOGEN      Result Value Range   Fibrinogen 280  204 - 475 mg/dL  FIBRINOGEN      Result Value Range   Fibrinogen 309  204 - 475 mg/dL  FIBRINOGEN      Result Value Range   Fibrinogen 325  204 - 475 mg/dL  CBC      Result Value Range   WBC 5.4  4.0 - 10.5 K/uL   RBC 3.52 (*) 4.22 - 5.81 MIL/uL   Hemoglobin 10.9 (*) 13.0 - 17.0 g/dL   HCT 84.6 (*) 96.2 - 95.2 %   MCV 91.2  78.0 - 100.0 fL   MCH 31.0  26.0 - 34.0 pg   MCHC 34.0  30.0 - 36.0 g/dL   RDW 84.1  32.4 - 40.1 %   Platelets 89 (*) 150 - 400 K/uL  FIBRINOGEN      Result Value Range   Fibrinogen 251  204 - 475 mg/dL  FIBRINOGEN      Result Value Range   Fibrinogen 210  204 - 475 mg/dL  FIBRINOGEN       Result Value Range   Fibrinogen 159 (*) 204 - 475 mg/dL  HEPARIN LEVEL (UNFRACTIONATED)      Result Value Range   Heparin Unfractionated 0.15 (*) 0.30 - 0.70 IU/mL  CBC      Result Value Range   WBC 5.4  4.0 - 10.5 K/uL   RBC 3.31 (*) 4.22 - 5.81 MIL/uL   Hemoglobin 10.4 (*) 13.0 - 17.0 g/dL   HCT 02.7 (*) 25.3 - 66.4 %   MCV 90.0  78.0 - 100.0 fL   MCH 31.4  26.0 - 34.0 pg   MCHC 34.9  30.0 - 36.0 g/dL   RDW 40.3  47.4 - 25.9 %   Platelets 79 (*) 150 - 400 K/uL  CBC      Result Value Range   WBC 5.4  4.0 - 10.5 K/uL   RBC 3.01 (*) 4.22 - 5.81 MIL/uL   Hemoglobin 9.6 (*) 13.0 - 17.0 g/dL   HCT 56.3 (*) 87.5 - 64.3 %   MCV 89.4  78.0 - 100.0 fL   MCH 31.9  26.0 - 34.0 pg   MCHC 35.7  30.0 - 36.0 g/dL   RDW 32.9  51.8 - 84.1 %   Platelets 73 (*) 150 - 400  K/uL  FIBRINOGEN      Result Value Range   Fibrinogen 141 (*) 204 - 475 mg/dL  HEPARIN LEVEL (UNFRACTIONATED)      Result Value Range   Heparin Unfractionated <0.10 (*) 0.30 - 0.70 IU/mL  FIBRINOGEN      Result Value Range   Fibrinogen 140 (*) 204 - 475 mg/dL  HEPARIN LEVEL (UNFRACTIONATED)      Result Value Range   Heparin Unfractionated <0.10 (*) 0.30 - 0.70 IU/mL  TECHNOLOGIST SMEAR REVIEW      Result Value Range   Tech Review SPHEROCYTES    CBC      Result Value Range   WBC 4.8  4.0 - 10.5 K/uL   RBC 2.35 (*) 4.22 - 5.81 MIL/uL   Hemoglobin 7.4 (*) 13.0 - 17.0 g/dL   HCT 40.9 (*) 81.1 - 91.4 %   MCV 87.2  78.0 - 100.0 fL   MCH 31.5  26.0 - 34.0 pg   MCHC 36.1 (*) 30.0 - 36.0 g/dL   RDW 78.2  95.6 - 21.3 %   Platelets 93 (*) 150 - 400 K/uL  FIBRINOGEN      Result Value Range   Fibrinogen 177 (*) 204 - 475 mg/dL  BASIC METABOLIC PANEL      Result Value Range   Sodium 133 (*) 135 - 145 mEq/L   Potassium 3.5  3.5 - 5.1 mEq/L   Chloride 103  96 - 112 mEq/L   CO2 24  19 - 32 mEq/L   Glucose, Bld 122 (*) 70 - 99 mg/dL   BUN 14  6 - 23 mg/dL   Creatinine, Ser 0.86  0.50 - 1.35 mg/dL   Calcium 7.5 (*) 8.4 -  10.5 mg/dL   GFR calc non Af Amer 75 (*) >90 mL/min   GFR calc Af Amer 87 (*) >90 mL/min  PROTIME-INR      Result Value Range   Prothrombin Time 14.3  11.6 - 15.2 seconds   INR 1.13  0.00 - 1.49  ANTITHROMBIN III      Result Value Range   AntiThromb III Func 72 (*) 75 - 120 %  LUPUS ANTICOAGULANT PANEL      Result Value Range   PTT Lupus Anticoagulant    28.0 - 43.0 secs   Value: Questionable validity of result, suggest recollect.   PTTLA Confirmation NOT APPL  <8.0 secs   PTTLA 4:1 Mix NOT APPL  28.0 - 43.0 secs   Drvvt    <42.9 secs   Value: Questionable validity of result, suggest recollect.   Drvvt confirmation NOT APPL  <1.11 Ratio   dRVVT Incubated 1:1 Mix NOT APPL  <42.9 secs   Lupus Anticoagulant    NOT DETECTED   Value: Questionable validity of result, suggest recollect.  BETA-2-GLYCOPROTEIN I ABS, IGG/M/A      Result Value Range   Beta-2 Glyco I IgG 9  <20 G Units   Beta-2-Glycoprotein I IgM 4  <20 M Units   Beta-2-Glycoprotein I IgA 7  <20 A Units  HOMOCYSTEINE      Result Value Range   Homocysteine 14.4  4.0 - 15.4 umol/L  FACTOR 5 LEIDEN      Result Value Range   Recommendations-[id number]: (NOTE)    CARDIOLIPIN ANTIBODIES, IGG, IGM, IGA      Result Value Range   Anticardiolipin IgG 6 (*) <23 GPL U/mL   Anticardiolipin IgM 0 (*) <11 MPL U/mL   Anticardiolipin IgA 3 (*) <22 APL  U/mL  CBC      Result Value Range   WBC 4.4  4.0 - 10.5 K/uL   RBC 2.17 (*) 4.22 - 5.81 MIL/uL   Hemoglobin 7.0 (*) 13.0 - 17.0 g/dL   HCT 53.6 (*) 64.4 - 03.4 %   MCV 87.6  78.0 - 100.0 fL   MCH 32.3  26.0 - 34.0 pg   MCHC 36.8 (*) 30.0 - 36.0 g/dL   RDW 74.2  59.5 - 63.8 %   Platelets 129 (*) 150 - 400 K/uL  PROTIME-INR      Result Value Range   Prothrombin Time 15.5 (*) 11.6 - 15.2 seconds   INR 1.25  0.00 - 1.49   A/P Successful lysis of pelvic thrombus with some residual DVT in filter. The LLE edema will resolve over time. I have reccommended knee high compression stockings.  Long term anticoagulation is recommend. He will need a FU appointment in our clinic.

## 2012-12-29 NOTE — Discharge Summary (Signed)
Physician Discharge Summary  Jonathan Ellison JYN:[id number] DOB: 12-01-1950 DOA: 12/25/2012  PCP: No primary provider on file.  Admit date: 12/25/2012 Discharge date: 12/29/2012  Recommendations for Outpatient Follow-up:  1. Patient twill take xarelto 15 mg BID for 3 weeks and then 20 mg daily indefinitely. 2. Please note that the patient has received 1 unit of PRBC prior to discharge. 3. Have PCP follow up on results of HIT panel  Discharge Diagnoses:  Principal Problem:   Phlegmasia cerulea dolens Active Problems:   Pain in limb   Normocytic anemia   Thrombocytopenia, unspecified  Discharge Condition: Medically stable for discharge home today.  Diet recommendation: As tolerated  History of present illness:  62 y.o. male with a past medical history of DVT/PE status post IVC filter who was admitted on 12/25/2012 with phlegmasia cerulea dolens and extensive DVT. He has undergone thrombolysis per interventional radiology with improvement in his pain and lower extremity swelling. Dr. 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 Recommendation was to have patient on xarelto indefinitely.  Assessment/Plan:   Principal Problem:  Phlegmasia cerulea dolens   Patient was seen by Dr. Edilia Bo of vascular surgery and Dr. Bonnielee Haff of interventional radiology and underwent emergent thrombolysis secondary to concerns for limb ischemia.   Subsequently placed on heparin, which was switched to Lovenox/Coumadin on 12/27/2012. Patient is going to be discahrged home with Xarelto as above indicated.  F/U evaluation by IR showed near resolution of DVT in right femoral system and improved DVT in bilateral iliac veins and IVC.   Hypercoagulable profile show a normal homocysteine level and a low antithrombin III level. Lupus anticoagulant, beta-2 glycoprotein, factor V Leiden, and Cardiolite and antibodies are all lower than the normal range indicated.   Active Problems:   Thrombocytopenia   Platelet count now trending up. HIT panel results pending at the time of discharge. Acute blood loss anemia  Likely secondary to recent procedure. We will transfuse today one unit of PRBC prior to discharge. Pain in limb   Oxycodone 5-10 mg every 4 hours as needed for moderate pain.  Code Status: Full  Family Communication: Fulton Mole (wife) [phone]; cell # [phone]. Family is not at bedside today. Disposition Plan: Home today  Medical Consultants:  Dr. Maryclare Bean, Interventional Radiology  Dr. Waverly Ferrari, Vascular Surgery  Dr. Si Gaul, Oncology Other Consultants:  None. Anti-infectives:  None.   Discharge Exam: Filed Vitals:   12/29/12 0552  BP: 112/64  Pulse: 80  Temp: 98.3 F (36.8 C)  Resp: 20   Filed Vitals:   12/28/12 1146 12/28/12 1327 12/28/12 2100 12/29/12 0552  BP: 113/45 115/88 83/51 112/64  Pulse: 83 84 78 80  Temp: 97.7 F (36.5 C) 97.9 F (36.6 C) 97.9 F (36.6 C) 98.3 F (36.8 C)  TempSrc:   Oral Oral  Resp: 18 20 18 20   Height:      Weight:      SpO2: 100% 97% 98% 100%    General: Pt is alert, follows commands appropriately, not in acute distress Cardiovascular: Regular rate and rhythm, S1/S2 +, no murmurs, no rubs, no gallops Respiratory: Clear to auscultation bilaterally, no wheezing, no crackles, no rhonchi Abdominal: Soft, non tender, non distended, bowel sounds +, no guarding Extremities: left pedal pitting edema, no cyanosis, pulses palpable bilaterally DP and PT Neuro: Grossly nonfocal  Discharge Instructions  Discharge Orders   Future Orders Complete By Expires     Call MD for:  difficulty breathing, headache or  visual disturbances  As directed     Call MD for:  persistant dizziness or light-headedness  As directed     Call MD for:  persistant nausea and vomiting  As directed     Call MD for:  redness, tenderness, or signs of infection (pain, swelling, redness, odor or green/yellow discharge around  incision site)  As directed     Diet - low sodium heart healthy  As directed     Discharge instructions  As directed     Comments:      1. Continue xarelto 15 mg BID for next 3 weeks, and date 01/19/2013. Then continue xarelto 20 mg daily indefinitely.    Increase activity slowly  As directed         Medication List    TAKE these medications       HYDROmorphone 4 MG tablet  Commonly known as:  DILAUDID  Take 1 tablet (4 mg total) by mouth every 4 (four) hours as needed for pain.     LORazepam 1 MG tablet  Commonly known as:  ATIVAN  Take 1 tablet (1 mg total) by mouth every 8 (eight) hours.     ondansetron 4 MG tablet  Commonly known as:  ZOFRAN  Take 1 tablet (4 mg total) by mouth every 8 (eight) hours as needed for nausea.     oxyCODONE 5 MG immediate release tablet  Commonly known as:  Oxy IR/ROXICODONE  Take 1-2 tablets (5-10 mg total) by mouth every 4 (four) hours as needed for pain.     polyethylene glycol packet  Commonly known as:  MIRALAX / GLYCOLAX  Take 17 g by mouth daily as needed.     Rivaroxaban 15 MG Tabs tablet  Commonly known as:  XARELTO  Take 1 tablet (15 mg total) by mouth 2 (two) times daily.     Rivaroxaban 20 MG Tabs  Commonly known as:  XARELTO  Take 1 tablet (20 mg total) by mouth daily.  Start taking on:  01/20/2013          The results of significant diagnostics from this hospitalization (including imaging, microbiology, ancillary and laboratory) are listed below for reference.    Significant Diagnostic Studies: Dg Chest 2 View  12/25/2012  *RADIOLOGY REPORT*  Clinical Data: Increase shortness of breath with activity.  CHEST - 2 VIEW  Comparison: None.  Findings: Normal heart size and pulmonary vascularity.  Scattered calcified granulomas in the lungs with calcified left hilar lymph nodes.  No focal consolidation or airspace disease.  No blunting of costophrenic angles.  No pneumothorax.  Degenerative changes in the spine.  Surgical clips  in the right upper quadrant.  Postoperative change in the right shoulder.  IMPRESSION: No evidence of active pulmonary disease.   Original Report Authenticated By: Burman Nieves, M.D.    Ct Abdomen Pelvis W Contrast  12/25/2012  *RADIOLOGY REPORT*  Clinical Data: Abdominal pain and back pain.  CT ABDOMEN AND PELVIS WITH CONTRAST  Technique:  Multidetector CT imaging of the abdomen and pelvis was performed following the standard protocol during bolus administration of intravenous contrast.  Contrast: 50mL OMNIPAQUE IOHEXOL 300 MG/ML  SOLN, OMNIPAQUE IOHEXOL 300 MG/ML  SOLN  Comparison: None.  Findings: The patient has an IVC filter in place.  The lower IVC and common iliac and femoral veins are distended and there is slight soft tissue stranding around the distal inferior vena cava. I suspect the patient may have acutely trapped a large thrombus in  the inferior vena cava below the filter.  The liver, spleen, pancreas, adrenal glands, and kidneys demonstrate no significant abnormalities.  There is an 18 x 11 x 13 mm lesion in the anterior aspect of the left lobe the liver, probably a small hemangioma.  There is a small parapelvic cyst in the left kidney.  There are a few calcified granulomas in the spleen. Gallbladder has been removed.  There are numerous diverticula in the descending and proximal sigmoid portions of the colon.  No significant osseous abnormality.  IMPRESSION:  1. Slight soft tissue stranding around the distal inferior vena cava with distention of the distal IVC and iliac veins and femoral vein suggesting acute thrombosis below the IVC filter. 2. Diverticulosis.   Original Report Authenticated By: Francene Boyers, M.D.    Ir Veno/ext/bi  12/26/2012  *RADIOLOGY REPORT*  Clinical Data/Indication: PLEGMASIA OF THE RIGHT LOWER EXTREMITY. MASSIVE RIGHT LOWER EXTREMITY DVT.  LEFT COMMON ILIAC VEIN DVT.  RADIOLOGY EXAMINATION,IR ULTRASOUND GUIDANCE VASC ACCESS LEFT,IR ULTRASOUND GUIDANCE VASC  ACCESS RIGHT,BILATERAL EXTREMITY VENOGRAPHY  Sedation: Versed zero mg, Fentanyl 100 mg.  Total Moderate Sedation Time: 60 minutes.  Contrast Volume: 60 ml Omnipaque-300.  Additional Medications: Morphine 2 mg.  Fluoroscopy Time: 5.7 minutes.  Procedure: The procedure, risks, benefits, and alternatives were explained to the patient. Questions regarding the procedure were encouraged and answered. The patient understands and consents to the procedure.  The bilateral popliteal fossas in the prone position were prepped with betadine in a sterile fashion, and a sterile drape was applied covering the operative field. A sterile gown and sterile gloves were used for the procedure.  Under sonographic guidance, a micropuncture needle was inserted into the left popliteal vein and removed over a 018 wire which was upsized to a [place].  A 7-French sheath was inserted.  The identical procedure was performed in the right popliteal fossa.  Bilateral lower extremity venography was performed.  This confirmed diffuse DVT in the right lower extremity and occlusive thrombus in the left common iliac vein.  A vertebral catheter was then advanced over a Benson wire across the left common iliac vein occlusion, into the IVC, and above the IVC filter.  It was removed over an exchange length Amplatz wire. The identical procedure was performed from the right popliteal vein.  90 cm length infusion catheters were then inserted.  A 20-cm infusion length was placed across the left common iliac vein occlusion.  A 50 cm infusion length catheter was placed from the right femoral vein to the IVC.  TNK was then instituted at 0.25 mg per hour per catheter.  Peripheral heparin was instituted.  Sheaths were sewn in place and capped open with a sterile normal saline K A V L.  Findings: Left lower extremity venography demonstrates occlusive DVT in the left common iliac veins and IVC to the level of the IVC filter.  Right lower extremity venography  demonstrates massive DVT throughout the right femoral vein, popliteal vein, common femoral vein, and iliac venous system.  Complications: None.  IMPRESSION: Venography confirms bilateral DVT.  Infusion was instituted via bilateral popliteal fossa approach with a total of 0.5 mg TNK per hour.   Original Report Authenticated By: Jolaine Click, M.D.    Ir US Guide Vasc Access Left  12/26/2012  *RADIOLOGY REPORT*  Clinical Data/Indication: PLEGMASIA OF THE RIGHT LOWER EXTREMITY. MASSIVE RIGHT LOWER EXTREMITY DVT.  LEFT COMMON ILIAC VEIN DVT.  RADIOLOGY EXAMINATION,IR ULTRASOUND GUIDANCE VASC ACCESS LEFT,IR ULTRASOUND GUIDANCE VASC ACCESS RIGHT,BILATERAL EXTREMITY  VENOGRAPHY  Sedation: Versed zero mg, Fentanyl 100 mg.  Total Moderate Sedation Time: 60 minutes.  Contrast Volume: 60 ml Omnipaque-300.  Additional Medications: Morphine 2 mg.  Fluoroscopy Time: 5.7 minutes.  Procedure: The procedure, risks, benefits, and alternatives were explained to the patient. Questions regarding the procedure were encouraged and answered. The patient understands and consents to the procedure.  The bilateral popliteal fossas in the prone position were prepped with betadine in a sterile fashion, and a sterile drape was applied covering the operative field. A sterile gown and sterile gloves were used for the procedure.  Under sonographic guidance, a micropuncture needle was inserted into the left popliteal vein and removed over a 018 wire which was upsized to a [place].  A 7-French sheath was inserted.  The identical procedure was performed in the right popliteal fossa.  Bilateral lower extremity venography was performed.  This confirmed diffuse DVT in the right lower extremity and occlusive thrombus in the left common iliac vein.  A vertebral catheter was then advanced over a Benson wire across the left common iliac vein occlusion, into the IVC, and above the IVC filter.  It was removed over an exchange length Amplatz wire. The identical  procedure was performed from the right popliteal vein.  90 cm length infusion catheters were then inserted.  A 20-cm infusion length was placed across the left common iliac vein occlusion.  A 50 cm infusion length catheter was placed from the right femoral vein to the IVC.  TNK was then instituted at 0.25 mg per hour per catheter.  Peripheral heparin was instituted.  Sheaths were sewn in place and capped open with a sterile normal saline K A V L.  Findings: Left lower extremity venography demonstrates occlusive DVT in the left common iliac veins and IVC to the level of the IVC filter.  Right lower extremity venography demonstrates massive DVT throughout the right femoral vein, popliteal vein, common femoral vein, and iliac venous system.  Complications: None.  IMPRESSION: Venography confirms bilateral DVT.  Infusion was instituted via bilateral popliteal fossa approach with a total of 0.5 mg TNK per hour.   Original Report Authenticated By: Jolaine Click, M.D.    Ir US Guide Vasc Access Right  12/26/2012  *RADIOLOGY REPORT*  Clinical Data/Indication: PLEGMASIA OF THE RIGHT LOWER EXTREMITY. MASSIVE RIGHT LOWER EXTREMITY DVT.  LEFT COMMON ILIAC VEIN DVT.  RADIOLOGY EXAMINATION,IR ULTRASOUND GUIDANCE VASC ACCESS LEFT,IR ULTRASOUND GUIDANCE VASC ACCESS RIGHT,BILATERAL EXTREMITY VENOGRAPHY  Sedation: Versed zero mg, Fentanyl 100 mg.  Total Moderate Sedation Time: 60 minutes.  Contrast Volume: 60 ml Omnipaque-300.  Additional Medications: Morphine 2 mg.  Fluoroscopy Time: 5.7 minutes.  Procedure: The procedure, risks, benefits, and alternatives were explained to the patient. Questions regarding the procedure were encouraged and answered. The patient understands and consents to the procedure.  The bilateral popliteal fossas in the prone position were prepped with betadine in a sterile fashion, and a sterile drape was applied covering the operative field. A sterile gown and sterile gloves were used for the procedure.   Under sonographic guidance, a micropuncture needle was inserted into the left popliteal vein and removed over a 018 wire which was upsized to a [place].  A 7-French sheath was inserted.  The identical procedure was performed in the right popliteal fossa.  Bilateral lower extremity venography was performed.  This confirmed diffuse DVT in the right lower extremity and occlusive thrombus in the left common iliac vein.  A vertebral catheter was then advanced over a  Benson wire across the left common iliac vein occlusion, into the IVC, and above the IVC filter.  It was removed over an exchange length Amplatz wire. The identical procedure was performed from the right popliteal vein.  90 cm length infusion catheters were then inserted.  A 20-cm infusion length was placed across the left common iliac vein occlusion.  A 50 cm infusion length catheter was placed from the right femoral vein to the IVC.  TNK was then instituted at 0.25 mg per hour per catheter.  Peripheral heparin was instituted.  Sheaths were sewn in place and capped open with a sterile normal saline K A V L.  Findings: Left lower extremity venography demonstrates occlusive DVT in the left common iliac veins and IVC to the level of the IVC filter.  Right lower extremity venography demonstrates massive DVT throughout the right femoral vein, popliteal vein, common femoral vein, and iliac venous system.  Complications: None.  IMPRESSION: Venography confirms bilateral DVT.  Infusion was instituted via bilateral popliteal fossa approach with a total of 0.5 mg TNK per hour.   Original Report Authenticated By: Jolaine Click, M.D.    Ir Infusion Thrombol Venous Initial (ms)  12/26/2012  *RADIOLOGY REPORT*  Clinical Data/Indication: PLEGMASIA OF THE RIGHT LOWER EXTREMITY. MASSIVE RIGHT LOWER EXTREMITY DVT.  LEFT COMMON ILIAC VEIN DVT.  RADIOLOGY EXAMINATION,IR ULTRASOUND GUIDANCE VASC ACCESS LEFT,IR ULTRASOUND GUIDANCE VASC ACCESS RIGHT,BILATERAL EXTREMITY VENOGRAPHY   Sedation: Versed zero mg, Fentanyl 100 mg.  Total Moderate Sedation Time: 60 minutes.  Contrast Volume: 60 ml Omnipaque-300.  Additional Medications: Morphine 2 mg.  Fluoroscopy Time: 5.7 minutes.  Procedure: The procedure, risks, benefits, and alternatives were explained to the patient. Questions regarding the procedure were encouraged and answered. The patient understands and consents to the procedure.  The bilateral popliteal fossas in the prone position were prepped with betadine in a sterile fashion, and a sterile drape was applied covering the operative field. A sterile gown and sterile gloves were used for the procedure.  Under sonographic guidance, a micropuncture needle was inserted into the left popliteal vein and removed over a 018 wire which was upsized to a [place].  A 7-French sheath was inserted.  The identical procedure was performed in the right popliteal fossa.  Bilateral lower extremity venography was performed.  This confirmed diffuse DVT in the right lower extremity and occlusive thrombus in the left common iliac vein.  A vertebral catheter was then advanced over a Benson wire across the left common iliac vein occlusion, into the IVC, and above the IVC filter.  It was removed over an exchange length Amplatz wire. The identical procedure was performed from the right popliteal vein.  90 cm length infusion catheters were then inserted.  A 20-cm infusion length was placed across the left common iliac vein occlusion.  A 50 cm infusion length catheter was placed from the right femoral vein to the IVC.  TNK was then instituted at 0.25 mg per hour per catheter.  Peripheral heparin was instituted.  Sheaths were sewn in place and capped open with a sterile normal saline K A V L.  Findings: Left lower extremity venography demonstrates occlusive DVT in the left common iliac veins and IVC to the level of the IVC filter.  Right lower extremity venography demonstrates massive DVT throughout the right femoral  vein, popliteal vein, common femoral vein, and iliac venous system.  Complications: None.  IMPRESSION: Venography confirms bilateral DVT.  Infusion was instituted via bilateral popliteal fossa approach with a  total of 0.5 mg TNK per hour.   Original Report Authenticated By: Jolaine Click, M.D.    Ir Rande Lawman F/u Eval Art/ven Basil Dess Day (ms)  12/26/2012  *RADIOLOGY REPORT*  Clinical Data/Indication: MASSIVE LOWER EXTREMITY DVT  BILATERAL LOWER EXTREMITY VENOUS LYSIS CHECK POST 20 HOURS.  Fluoroscopy Time: 1.0 minutes.  Procedure: The procedure, risks, benefits, and alternatives were explained to the patient. Questions regarding the procedure were encouraged and answered. The patient understands and consents to the procedure.  Contrast was injected into both lower extremities for bilateral lower extremity venography.  Findings: Left lower extremity venography demonstrates maintained patency throughout the left thigh and external iliac vein.  There has been an estimated 25% reduction of the thrombus in the left common iliac vein extending into the IVC.  Nearly occlusive thrombus in the IVC persists.  Right lower extremity venography demonstrates near complete resolution of thrombus in the right femoral venous system.  There is some improvement within the occlusive thrombus in the right common and external iliac veins.  Approximately 10% thrombus reduction has occurred.  There is persistent nearly occlusive thrombus within the IVC trapped within the IVC filter.  Complications: None.  IMPRESSION: After 20 hours of lysis, there has been significant improvement. Lysis for additional 24 hours will be performed.   Original Report Authenticated By: Jolaine Click, M.D.    Ir Rande Lawman F/u Eval Art/ven Final Day (ms)  12/27/2012  *RADIOLOGY REPORT*  Indication: History of bilateral lower extremity occlusive DVT with extension to the IVC.  History of IVC filter placement (at least 2 years ago) at outside facility.  BILATERAL LOWER  EXTREMITY VENOUS LYSIS CHECK - FINAL DAY  Comparisons: Bilateral lower extremity venous lysis check dash 12/26/2012; ultrasound and fluoroscopic guided infusion catheter placement - 12/25/2012  Intravenous Medications: None  Contrast: 70 ml Omnipaque-300  Fluoroscopy Time: 1.7 minutes.  Complications: None immediate  Technique:  Informed written consent was obtained from the patient after a discussion of the risks, benefits and alternatives to treatment. Questions regarding the procedure were encouraged and answered.  A timeout was performed prior to the initiation of the procedure.  The patient was placed prone on the fluoroscopy table and the external portion of the existing bilateral popliteal approach vascular sheathes and infusion catheters as well as the surrounding skin were prepped and draped in the usual sterile fashion, and a sterile drape was applied covering the operative field.  Maximum barrier sterile technique with sterile gowns and gloves were used for the procedure.  Initial venogram was performed through the left lower extremity approach infusion catheter.  Subsequent left lower extremity venograms were performed through the side arm of the existing left popliteal vein vascular sheath.  Right lower extremity venograms were performed at multiple stations via the side arm of the existing contralateral right lower extremity popliteal vein approach vascular sheath.  Given suboptimal opacification of the right-sided pelvic vasculature, the right lower extremity approach infusion catheter was exchanged for a vertebral catheter over a guidewire.  The vertebral catheter was positioned at the level of the peripheral aspect of the right external iliac vein.  A repeat right lower extremity central venogram was performed from this more central location.  Images were reviewed and the lysis was completed.  All catheters and sheaths were removed and hemostasis was achieved at the bilateral popliteal access sitees  with manual compression. Dressings were placed.  The patient tolerated procedure well without immediate postprocedural complication.  Findings:  Left lower extremity: The left common and  external iliac veins are widely patent without evidence of stenosis.  There is a very minimal amount of nonocclusive DVT within the distal aspect of the left superficial femoral vein a component of which is likely associated with the popliteal vein approach vascular sheath and infusion catheter.  Slightly sluggish flow is noted through the left lower extremity, however there is restored antegrade flow without filling of venous collaterals.  Right lower extremity:  The right common and external iliac veins are widely patent without evidence of stenosis.  There is a minimal amount of likely chronic nonocclusive DVT within the distal aspect of the right superficial femoral vein regional to the abductor canal.  There is minimal filling of several small venous collaterals at this location.  The remainder of the right superficial femoral vein is widely patent to the level of the pelvis.   Slightly sluggish antegrade flow is noted throughout the right lower extremity.  IVC: There is a minimal amount of residual nonocclusive thrombus within the infrarenal IVC filter.  The IVC is otherwise patent with restoration of antegrade flow.  Impression:  1.  Technically successful bilateral lower extremity venous lysis with restoration of antegrade flow throughout the bilateral lower extremities and through the IVC. 2.  Minimal amount of nonocclusive DVT remains within the IVC filter. 3.  Minimal amount of nonocclusive DVT within the distal aspects of the bilateral superficial femoral veins, the right side of which is felt to likely be chronic in etiology.  Plan:  Given concern for patient possibly developing HIT, the patient will be transitioned from heparin drip to Lovenox injections.  Referral will be made to Hematology for coagulopathy workup and  management of anticoagulation.  In the meantime, the patient will be started on Coumadin.  The patient will be fitted with stockings prior to discharge.  The patient will followup with Dr. Bonnielee Haff at [facility] [phone]) within 2 to 4 weeks after discharge.  Above findings discussed with Dr. Darnelle Catalan at the time of procedure completion.   Original Report Authenticated By: Tacey Ruiz, MD     Microbiology: Recent Results (from the past 240 hour(s))  MRSA PCR SCREENING     Status: None   Collection Time    12/25/12  4:57 PM      Result Value Range Status   MRSA by PCR NEGATIVE  NEGATIVE Final   Comment:            The GeneXpert MRSA Assay (FDA     approved for NASAL specimens     only), is one component of a     comprehensive MRSA colonization     surveillance program. It is not     intended to diagnose MRSA     infection nor to guide or     monitor treatment for     MRSA infections.     Labs: Basic Metabolic Panel:  Recent Labs Lab 12/25/12 0550 12/28/12 0405  NA 133* 133*  K 3.5 3.5  CL 98 103  CO2 21 24  GLUCOSE 131* 122*  BUN 20 14  CREATININE 1.17 1.03  CALCIUM 9.5 7.5*   Liver Function Tests:  Recent Labs Lab 12/25/12 0550  AST 15  ALT 12  ALKPHOS 81  BILITOT 2.8*  PROT 7.1  ALBUMIN 3.8    Recent Labs Lab 12/25/12 0731  LIPASE 44   No results found for this basename: AMMONIA,  in the last 168 hours CBC:  Recent Labs Lab 12/25/12 0550  12/26/12  0710 12/26/12 1612 12/27/12 0500 12/28/12 0405 12/29/12 0350  WBC 7.8  < > 5.4 5.4 5.4 4.8 4.4  NEUTROABS 5.6  --   --   --   --   --   --   HGB 12.7*  < > 10.9* 10.4* 9.6* 7.4* 7.0*  HCT 36.7*  < > 32.1* 29.8* 26.9* 20.5* 19.0*  MCV 89.3  < > 91.2 90.0 89.4 87.2 87.6  PLT 132*  < > 89* 79* 73* 93* 129*  < > = values in this interval not displayed. Cardiac Enzymes:  Recent Labs Lab 12/25/12 1332  CKTOTAL 57   BNP: BNP (last 3 results) No results found for this basename:  PROBNP,  in the last 8760 hours CBG: No results found for this basename: GLUCAP,  in the last 168 hours  Time coordinating discharge: Over 30 minutes  Signed:  Manson Passey, MD  [facility]  12/29/2012, 12:39 PM  Pager #: [phone]

## 2012-12-29 NOTE — Progress Notes (Signed)
ANTICOAGULATION CONSULT NOTE - F/U Consult  Pharmacy Consult for Lovenox/warfarin Indication: extensive DVT  No Known Allergies  Patient Measurements: Height: 5\' 8"  (172.7 cm) Weight: 172 lb 8 oz (78.245 kg) IBW/kg (Calculated) : 63.9  Vital Signs: Temp: 98.3 F (36.8 C) (03/19 0552) Temp src: Oral (03/19 0552) BP: 112/64 mmHg (03/19 0552) Pulse Rate: 80 (03/19 0552)  Labs:  Recent Labs  12/26/12 1612 12/26/12 2315 12/27/12 0500 12/27/12 0825 12/28/12 0405 12/29/12 0350  HGB 10.4*  --  9.6*  --  7.4* 7.0*  HCT 29.8*  --  26.9*  --  20.5* 19.0*  PLT 79*  --  73*  --  93* 129*  LABPROT  --   --   --   --  14.3 15.5*  INR  --   --   --   --  1.13 1.25  HEPARINUNFRC 0.15* <0.10*  --  <0.10*  --   --   CREATININE  --   --   --   --  1.03  --    Estimated Creatinine Clearance: 71 ml/min (by C-G formula based on Cr of 1.03).  Assessment: 3 yom with h/o PE, s/p IVC filter (no anticoag) presented 3/15 with extensive, nonocclusive, acute deep vein thrombosis involving the right saphenofemoral junction, common femoral, femoral, popliteal, posterior tibial, and peroneal veins.    MD initially ordered for Lovenox dosing for VTE treatment, but was transitioned to IV Heparin on 3/15.  3/15 pm, MD ordered Tenecteplase @ 0.25mg /hr via bilateral LE popliteal vein access, in addition to IV heparin.  3/17 per IR, catheter-directed lytic therapy was successful so tenecteplase discontinued.    Post procedure, patient was transitioned from IV heparin to Lovenox 80 mg bid + warfarin on 3/17  D3 overlap; INR = 1.25 after 2 x 7.5mg  Warfarin  SCr remains stable, wnl.  CrCl ~ 71 ml/min.  Hgb (7.0) blood loss noted/IR procedure.  Plt (129) continue to improv.  Monitor for bleeding.  HIT panel ordered 3/17 per MD.  Hyper Coag panel: all negative to date (Anticardiolipin Ab, Factor V Leiden, Beta-2 Glycoprotein, question of Lupus anticoagulant sample validity?)  Goal of Therapy:  Minimum  of 5 day overlap Lovenox/Warfarin with 48 hr therapeutic INR during overlap 4-hour LMWH level goal 0.6-1.2 INR 2-3 Monitor platelets by anticoagulation protocol: Yes   Plan:  Continue Lovenox 80mg  SQ q12h Repeat warfarin 7.5 mg once at 1200. Patient provided warfarin booklet and video.  Pharmacist education to follow. Continue to monitor H&H and platelets, HIT panel when available. Xarelto is preferred anticoagulant per Onc MD, assessment by CM for insurance coverage.  Otho Bellows PharmD Pager [phone] 12/29/2012, 9:02 AM

## 2012-12-30 ENCOUNTER — Other Ambulatory Visit (HOSPITAL_COMMUNITY): Payer: Self-pay | Admitting: Interventional Radiology

## 2012-12-30 DIAGNOSIS — I82403 Acute embolism and thrombosis of unspecified deep veins of lower extremity, bilateral: Secondary | ICD-10-CM

## 2012-12-30 LAB — TYPE AND SCREEN
Antibody Screen: NEGATIVE
Unit division: 0

## 2012-12-31 ENCOUNTER — Telehealth: Payer: Self-pay | Admitting: *Deleted

## 2012-12-31 NOTE — Telephone Encounter (Signed)
Pt called to report d/c'd from hospital on 12/29/12, had DVTs s/p thrombolysis by Dr. Bonnielee Haff.  His left calf was swollen on d/c but now he had increased swelling, tightness and throbbing in left calf after standing to take a shower.  The swelling has subsided some since lying down w/ leg elevated.  He says his d/c orders said to call Dr. Arbutus Ped if any complications.  He is also suppose to make appt w/ PCP, Dr. Larina Bras, in one to two weeks.  Pt is taking Xarelto as directed.  Instructed pt to continue to keep leg elevated as much as possible and to call Dr. Hayden Rasmussen office now to inform them of above and get appointment.  Will notify Dr. Arbutus Ped for any further suggestions.

## 2012-12-31 NOTE — Telephone Encounter (Signed)
Called pt to check on his swelling.  He says it is getting better.  Suggested he wear compression stockings as ordered.  He says his leg still too big to get them on, although the swelling is improving with keeping legs elevated.  He did call Dr. Hayden Rasmussen office and waiting for return call.   Instructed pt to continue to keep legs elevated as much as possible and call Dr. Hayden Rasmussen office again on Monday for appt.   Go to ED if any worsening of swelling that does not improve w/ elevating legs.  He verbalized understanding.

## 2013-01-03 ENCOUNTER — Telehealth: Payer: Self-pay | Admitting: Medical Oncology

## 2013-01-03 NOTE — Telephone Encounter (Signed)
Pt talked to Dr Orest Dikes today about his left leg and pt said Dr Orest Dikes told him to keep his left leg elevated. I confirmed f/u appt with Dr Mohamed/.

## 2013-01-04 ENCOUNTER — Telehealth: Payer: Self-pay | Admitting: Radiology

## 2013-01-04 NOTE — Telephone Encounter (Signed)
Patient spoke w/ Dr Grace Isaac yesterday.  Today's call is for follow up to check patient's status.  Patient states that he is keeping LLE elevated & has noticed some improvement of LLE edema (below knee).   States that he has avoided weight bearing on LLE.  Describes discomfort as a "pins and needles" sensation.  Also states that he is experiencing some aching of Left calf.  Has been taking Oxycodone as needed for discomfort.  Has not been wearing compression garment on LLE.  Contacted Dr Bonnielee Haff for further instructions.  I  Instructions relayed to patient as follows:  Continue elevation.  Ankle exercises.  Use of compression garment.  May call to schedule f/u appointment with Dr Grace Isaac on Thursday if needed.  Patient states that he understands.

## 2013-01-10 ENCOUNTER — Other Ambulatory Visit (HOSPITAL_COMMUNITY): Payer: Self-pay | Admitting: Interventional Radiology

## 2013-01-10 DIAGNOSIS — I82403 Acute embolism and thrombosis of unspecified deep veins of lower extremity, bilateral: Secondary | ICD-10-CM

## 2013-01-12 ENCOUNTER — Other Ambulatory Visit (HOSPITAL_COMMUNITY): Payer: Self-pay | Admitting: Interventional Radiology

## 2013-01-12 DIAGNOSIS — I82403 Acute embolism and thrombosis of unspecified deep veins of lower extremity, bilateral: Secondary | ICD-10-CM

## 2013-01-13 ENCOUNTER — Ambulatory Visit
Admission: RE | Admit: 2013-01-13 | Discharge: 2013-01-13 | Disposition: A | Discharge status: Home / Self Care | Payer: BC Managed Care – PPO | Source: Ambulatory Visit | Attending: Interventional Radiology | Admitting: Interventional Radiology

## 2013-01-13 DIAGNOSIS — I82403 Acute embolism and thrombosis of unspecified deep veins of lower extremity, bilateral: Secondary | ICD-10-CM

## 2013-01-17 ENCOUNTER — Encounter: Payer: Self-pay | Admitting: Internal Medicine

## 2013-01-17 ENCOUNTER — Ambulatory Visit (HOSPITAL_BASED_OUTPATIENT_CLINIC_OR_DEPARTMENT_OTHER): Payer: BC Managed Care – PPO | Admitting: Internal Medicine

## 2013-01-17 ENCOUNTER — Other Ambulatory Visit: Payer: BC Managed Care – PPO

## 2013-01-17 ENCOUNTER — Ambulatory Visit: Payer: BC Managed Care – PPO

## 2013-01-17 VITALS — BP 125/70 | HR 86 | Temp 97.7°F | Resp 20 | Ht 68.0 in | Wt 166.1 lb

## 2013-01-17 DIAGNOSIS — I82409 Acute embolism and thrombosis of unspecified deep veins of unspecified lower extremity: Secondary | ICD-10-CM | POA: Insufficient documentation

## 2013-01-17 NOTE — Patient Instructions (Signed)
No hypercoagulable abnormality seen on your blood work. Continue Xarelto and follow up visit with your primary care physician as scheduled

## 2013-01-17 NOTE — Progress Notes (Signed)
[facility] [phone]   [phone]  OFFICE PROGRESS NOTE  No primary provider on file. No primary provider on file.  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  PRIOR THERAPY: 1) status post IVC filter placement in 2012 2) status post thrombolysis on 12/25/2012 40 recurrent deep venous thrombosis by interventional radiology.  CURRENT THERAPY: Xarelto 20 mg by mouth daily  INTERVAL HISTORY: Jonathan Ellison 62 y.o. Ellison returns to Jonathan clinic today for routine hospital follow up visit. Jonathan patient is feeling fine today with no specific complaints except for mild swelling in Jonathan right ankle. He was seen during his hospitalization at [facility] after venogram Jonathan thrombolytic therapy by interventional radiology for recurrent deep venous thrombosis of Jonathan right lower extremity started below Jonathan IVC filter. Jonathan patient had hypercoagulable panel performed during his hospitalization that showed no significant abnormalities. His platelets count were low at that time but that completely resolved. HIT panel performed at that time was negative. He is here today for evaluation Jonathan recommendation regarding his condition  MEDICAL HISTORY: Past Medical History  Diagnosis Date  . Kidney stones   . Pulmonary embolus   . S/P IVC filter     ALLERGIES:  has No Known Allergies.  MEDICATIONS:  Current Outpatient Prescriptions  Medication Sig Dispense Refill  . Elastic Bandages & Supports (MEDICAL COMPRESSION STOCKINGS) MISC 1 each by Does not apply route once.  1 each  0  . HYDROmorphone (DILAUDID) 4 MG tablet Take 1 tablet (4 mg total) by mouth every 4 (four) hours as needed for pain.  30 tablet  0  . LORazepam (ATIVAN) 1 MG tablet Take 1 tablet (1 mg total) by mouth every 8 (eight) hours.  30 tablet  0  . ondansetron (ZOFRAN) 4 MG tablet Take 1 tablet (4 mg total) by mouth every 8 (eight) hours as  needed for nausea.  20 tablet  0  . oxyCODONE (OXY IR/ROXICODONE) 5 MG immediate release tablet Take 1-2 tablets (5-10 mg total) by mouth every 4 (four) hours as needed for pain.  60 tablet  0  . polyethylene glycol (MIRALAX / GLYCOLAX) packet Take 17 g by mouth daily as needed.  14 each  3  . Rivaroxaban (XARELTO) 15 MG TABS tablet Take 1 tablet (15 mg total) by mouth 2 (two) times daily.  42 tablet  0  . [START ON 01/20/2013] Rivaroxaban (XARELTO) 20 MG TABS Take 1 tablet (20 mg total) by mouth daily.  30 tablet  0   No current facility-administered medications for this visit.    SURGICAL HISTORY:  Past Surgical History  Procedure Laterality Date  . Cholecystectomy    . Abdominal surgery    . Rotator cuff surgery      REVIEW OF SYSTEMS:  A comprehensive review of systems was negative.   PHYSICAL EXAMINATION: General appearance: alert, cooperative Jonathan no distress Head: Normocephalic, without obvious abnormality, atraumatic Neck: no adenopathy Resp: clear to auscultation bilaterally Cardio: regular rate Jonathan rhythm, S1, S2 normal, no murmur, click, rub or gallop GI: soft, non-tender; bowel sounds normal; no masses,  no organomegaly Extremities: extremities normal, atraumatic, no cyanosis or edema  ECOG PERFORMANCE STATUS: 1 - Symptomatic but completely ambulatory  Blood pressure 125/70, pulse 86, temperature 97.7 F (36.5 C), temperature source Oral, resp. rate 20, height 5\' 8"  (1.727 m), weight 166 lb 1.6 oz (75.342 kg).  LABORATORY DATA: Lab Results  Component Value Date  WBC 4.4 12/29/2012   HGB 7.0* 12/29/2012   HCT 19.0* 12/29/2012   MCV 87.6 12/29/2012   PLT 129* 12/29/2012      Chemistry      Component Value Date/Time   NA 133* 12/28/2012 0405   K 3.5 12/28/2012 0405   CL 103 12/28/2012 0405   CO2 24 12/28/2012 0405   BUN 14 12/28/2012 0405   CREATININE 1.03 12/28/2012 0405      Component Value Date/Time   CALCIUM 7.5* 12/28/2012 0405   ALKPHOS 81 12/25/2012 0550   AST  15 12/25/2012 0550   ALT 12 12/25/2012 0550   BILITOT 2.8* 12/25/2012 0550       RADIOGRAPHIC STUDIES: Dg Chest 2 View  12/25/2012  *RADIOLOGY REPORT*  Clinical Data: Increase shortness of breath with activity.  CHEST - 2 VIEW  Comparison: None.  Findings: Normal heart size Jonathan pulmonary vascularity.  Scattered calcified granulomas in Jonathan lungs with calcified left hilar lymph nodes.  No focal consolidation or airspace disease.  No blunting of costophrenic angles.  No pneumothorax.  Degenerative changes in Jonathan spine.  Surgical clips in Jonathan right upper quadrant.  Postoperative change in Jonathan right shoulder.  IMPRESSION: No evidence of active pulmonary disease.   Original Report Authenticated By: Burman Nieves, M.D.    Ct Abdomen Pelvis W Contrast  12/25/2012  *RADIOLOGY REPORT*  Clinical Data: Abdominal pain Jonathan back pain.  CT ABDOMEN Jonathan PELVIS WITH CONTRAST  Technique:  Multidetector CT imaging of Jonathan abdomen Jonathan pelvis was performed following Jonathan standard protocol during bolus administration of intravenous contrast.  Contrast: 50mL OMNIPAQUE IOHEXOL 300 MG/ML  SOLN, OMNIPAQUE IOHEXOL 300 MG/ML  SOLN  Comparison: None.  Findings: Jonathan patient has an IVC filter in place.  Jonathan lower IVC Jonathan common iliac Jonathan femoral veins are distended Jonathan there is slight soft tissue stranding around Jonathan distal inferior vena cava. I suspect Jonathan patient may have acutely trapped a large thrombus in Jonathan inferior vena cava below Jonathan filter.  Jonathan liver, spleen, pancreas, adrenal glands, Jonathan kidneys demonstrate no significant abnormalities.  There is an 18 x 11 x 13 mm lesion in Jonathan anterior aspect of Jonathan left lobe Jonathan liver, probably a small hemangioma.  There is a small parapelvic cyst in Jonathan left kidney.  There are a few calcified granulomas in Jonathan spleen. Gallbladder has been removed.  There are numerous diverticula in Jonathan descending Jonathan proximal sigmoid portions of Jonathan colon.  No significant osseous abnormality.   IMPRESSION:  1. Slight soft tissue stranding around Jonathan distal inferior vena cava with distention of Jonathan distal IVC Jonathan iliac veins Jonathan femoral vein suggesting acute thrombosis below Jonathan IVC filter. 2. Diverticulosis.   Original Report Authenticated By: Francene Boyers, M.D.    Ir Veno/ext/bi  12/26/2012  *RADIOLOGY REPORT*  Clinical Data/Indication: PLEGMASIA OF Jonathan RIGHT LOWER EXTREMITY. MASSIVE RIGHT LOWER EXTREMITY DVT.  LEFT COMMON ILIAC VEIN DVT.  RADIOLOGY EXAMINATION,IR ULTRASOUND GUIDANCE VASC ACCESS LEFT,IR ULTRASOUND GUIDANCE VASC ACCESS RIGHT,BILATERAL EXTREMITY VENOGRAPHY  Sedation: Versed zero mg, Fentanyl 100 mg.  Total Moderate Sedation Time: 60 minutes.  Contrast Volume: 60 ml Omnipaque-300.  Additional Medications: Morphine 2 mg.  Fluoroscopy Time: 5.7 minutes.  Procedure: Jonathan procedure, risks, benefits, Jonathan alternatives were explained to Jonathan patient. Questions regarding Jonathan procedure were encouraged Jonathan answered. Jonathan patient understands Jonathan consents to Jonathan procedure.  Jonathan bilateral popliteal fossas in Jonathan prone position were prepped with betadine in a sterile fashion, Jonathan a sterile drape  was applied covering Jonathan operative field. A sterile gown Jonathan sterile gloves were used for Jonathan procedure.  Under sonographic guidance, a micropuncture needle was inserted into Jonathan left popliteal vein Jonathan removed over a 018 wire which was upsized to a [place].  A 7-French sheath was inserted.  Jonathan identical procedure was performed in Jonathan right popliteal fossa.  Bilateral lower extremity venography was performed.  This confirmed diffuse DVT in Jonathan right lower extremity Jonathan occlusive thrombus in Jonathan left common iliac vein.  A vertebral catheter was then advanced over a Benson wire across Jonathan left common iliac vein occlusion, into Jonathan IVC, Jonathan above Jonathan IVC filter.  It was removed over an exchange length Amplatz wire. Jonathan identical procedure was performed from Jonathan right popliteal vein.  90 cm length infusion catheters  were then inserted.  A 20-cm infusion length was placed across Jonathan left common iliac vein occlusion.  A 50 cm infusion length catheter was placed from Jonathan right femoral vein to Jonathan IVC.  TNK was then instituted at 0.25 mg per hour per catheter.  Peripheral heparin was instituted.  Sheaths were sewn in place Jonathan capped open with a sterile normal saline K A V L.  Findings: Left lower extremity venography demonstrates occlusive DVT in Jonathan left common iliac veins Jonathan IVC to Jonathan level of Jonathan IVC filter.  Right lower extremity venography demonstrates massive DVT throughout Jonathan right femoral vein, popliteal vein, common femoral vein, Jonathan iliac venous system.  Complications: None.  IMPRESSION: Venography confirms bilateral DVT.  Infusion was instituted via bilateral popliteal fossa approach with a total of 0.5 mg TNK per hour.   Original Report Authenticated By: Jolaine Click, M.D.    Ir Caffie Damme Ivc  01/10/2013  *RADIOLOGY REPORT*  Clinical Data/Indication: PLEGMASIA OF Jonathan RIGHT LOWER EXTREMITY. MASSIVE RIGHT LOWER EXTREMITY DVT.  LEFT COMMON ILIAC VEIN DVT.  INFERIOR VENA CAVOGRAM  Sedation: Versed zero mg, Fentanyl 100 mg.  Total Moderate Sedation Time: 60 minutes.  Contrast Volume: 60 ml Omnipaque-300.  Additional Medications: Morphine 2 mg.  Fluoroscopy Time: 5.7 minutes.  Procedure: Jonathan procedure, risks, benefits, Jonathan alternatives were explained to Jonathan patient. Questions regarding Jonathan procedure were encouraged Jonathan answered. Jonathan patient understands Jonathan consents to Jonathan procedure.  Jonathan bilateral popliteal fossas in Jonathan prone position were prepped with betadine in a sterile fashion, Jonathan a sterile drape was applied covering Jonathan operative field. A sterile gown Jonathan sterile gloves were used for Jonathan procedure.  Under sonographic guidance, a micropuncture needle was inserted into Jonathan left popliteal vein Jonathan removed over a 018 wire which was upsized to a [place].  A 7-French sheath was inserted.  Jonathan identical procedure was  performed in Jonathan right popliteal fossa.  Bilateral lower extremity venography was performed.  This confirmed diffuse DVT in Jonathan right lower extremity Jonathan occlusive thrombus in Jonathan left common iliac vein.  A vertebral catheter was then advanced over a Benson wire across Jonathan left common iliac vein occlusion, into Jonathan IVC, Jonathan above Jonathan IVC filter.  It was removed over an exchange length Amplatz wire. Jonathan identical procedure was performed from Jonathan right popliteal vein.  90 cm length infusion catheters were then inserted.  A 20-cm infusion length was placed across Jonathan left common iliac vein occlusion.  A 50 cm infusion length catheter was placed from Jonathan right femoral vein to Jonathan IVC.  TNK was then instituted at 0.25 mg per hour per catheter.  Peripheral heparin was instituted.  Sheaths were  sewn in place Jonathan capped open with a sterile normal saline K A V L.  Findings: Left lower extremity venography demonstrates occlusive DVT in Jonathan left common iliac veins Jonathan IVC to Jonathan level of Jonathan IVC filter.  Right lower extremity venography demonstrates massive DVT throughout Jonathan right femoral vein, popliteal vein, common femoral vein, Jonathan iliac venous system.  Complications: None.  IMPRESSION: Venography confirms bilateral DVT.  Infusion was instituted via bilateral popliteal fossa approach with a total of 0.5 mg TNK per hour.   Original Report Authenticated By: Jolaine Click, M.D.    US Venous Img Lower Bilateral  01/13/2013  *RADIOLOGY REPORT*  Clinical Data: History of bilateral lower extremity catheter directed venous lysis procedure with persistent left lower extremity pain Jonathan swelling.  BILATERAL LOWER EXTREMITY VENOUS DUPLEX ULTRASOUND  Technique:  Gray-scale sonography with graded compression, as well as color Doppler Jonathan duplex ultrasound, were performed to evaluate Jonathan deep venous system of both lower extremities from Jonathan level of Jonathan common femoral vein through Jonathan popliteal Jonathan proximal calf veins.  Spectral Doppler  was utilized to evaluate flow at rest Jonathan with distal augmentation maneuvers.  Comparison:  None.  Findings:  Normal compressibility of bilateral common femoral, superficial femoral, Jonathan popliteal veins is demonstrated, as well as Jonathan visualized proximal calf veins.  No filling defects to suggest DVT on grayscale or color Doppler imaging.  Doppler waveforms show normal direction of venous flow, normal respiratory phasicity Jonathan response to augmentation.  IMPRESSION: No evidence of acute or chronic deep vein thrombosis within either lower extremity.   Original Report Authenticated By: Tacey Ruiz, MD    Ir US Guide Vasc Access Left  12/26/2012  *RADIOLOGY REPORT*  Clinical Data/Indication: PLEGMASIA OF Jonathan RIGHT LOWER EXTREMITY. MASSIVE RIGHT LOWER EXTREMITY DVT.  LEFT COMMON ILIAC VEIN DVT.  RADIOLOGY EXAMINATION,IR ULTRASOUND GUIDANCE VASC ACCESS LEFT,IR ULTRASOUND GUIDANCE VASC ACCESS RIGHT,BILATERAL EXTREMITY VENOGRAPHY  Sedation: Versed zero mg, Fentanyl 100 mg.  Total Moderate Sedation Time: 60 minutes.  Contrast Volume: 60 ml Omnipaque-300.  Additional Medications: Morphine 2 mg.  Fluoroscopy Time: 5.7 minutes.  Procedure: Jonathan procedure, risks, benefits, Jonathan alternatives were explained to Jonathan patient. Questions regarding Jonathan procedure were encouraged Jonathan answered. Jonathan patient understands Jonathan consents to Jonathan procedure.  Jonathan bilateral popliteal fossas in Jonathan prone position were prepped with betadine in a sterile fashion, Jonathan a sterile drape was applied covering Jonathan operative field. A sterile gown Jonathan sterile gloves were used for Jonathan procedure.  Under sonographic guidance, a micropuncture needle was inserted into Jonathan left popliteal vein Jonathan removed over a 018 wire which was upsized to a [place].  A 7-French sheath was inserted.  Jonathan identical procedure was performed in Jonathan right popliteal fossa.  Bilateral lower extremity venography was performed.  This confirmed diffuse DVT in Jonathan right lower extremity Jonathan  occlusive thrombus in Jonathan left common iliac vein.  A vertebral catheter was then advanced over a Benson wire across Jonathan left common iliac vein occlusion, into Jonathan IVC, Jonathan above Jonathan IVC filter.  It was removed over an exchange length Amplatz wire. Jonathan identical procedure was performed from Jonathan right popliteal vein.  90 cm length infusion catheters were then inserted.  A 20-cm infusion length was placed across Jonathan left common iliac vein occlusion.  A 50 cm infusion length catheter was placed from Jonathan right femoral vein to Jonathan IVC.  TNK was then instituted at 0.25 mg per hour per catheter.  Peripheral heparin was instituted.  Sheaths were sewn in place Jonathan capped open with a sterile normal saline K A V L.  Findings: Left lower extremity venography demonstrates occlusive DVT in Jonathan left common iliac veins Jonathan IVC to Jonathan level of Jonathan IVC filter.  Right lower extremity venography demonstrates massive DVT throughout Jonathan right femoral vein, popliteal vein, common femoral vein, Jonathan iliac venous system.  Complications: None.  IMPRESSION: Venography confirms bilateral DVT.  Infusion was instituted via bilateral popliteal fossa approach with a total of 0.5 mg TNK per hour.   Original Report Authenticated By: Jolaine Click, M.D.    Ir US Guide Vasc Access Right  12/26/2012  *RADIOLOGY REPORT*  Clinical Data/Indication: PLEGMASIA OF Jonathan RIGHT LOWER EXTREMITY. MASSIVE RIGHT LOWER EXTREMITY DVT.  LEFT COMMON ILIAC VEIN DVT.  RADIOLOGY EXAMINATION,IR ULTRASOUND GUIDANCE VASC ACCESS LEFT,IR ULTRASOUND GUIDANCE VASC ACCESS RIGHT,BILATERAL EXTREMITY VENOGRAPHY  Sedation: Versed zero mg, Fentanyl 100 mg.  Total Moderate Sedation Time: 60 minutes.  Contrast Volume: 60 ml Omnipaque-300.  Additional Medications: Morphine 2 mg.  Fluoroscopy Time: 5.7 minutes.  Procedure: Jonathan procedure, risks, benefits, Jonathan alternatives were explained to Jonathan patient. Questions regarding Jonathan procedure were encouraged Jonathan answered. Jonathan patient understands  Jonathan consents to Jonathan procedure.  Jonathan bilateral popliteal fossas in Jonathan prone position were prepped with betadine in a sterile fashion, Jonathan a sterile drape was applied covering Jonathan operative field. A sterile gown Jonathan sterile gloves were used for Jonathan procedure.  Under sonographic guidance, a micropuncture needle was inserted into Jonathan left popliteal vein Jonathan removed over a 018 wire which was upsized to a [place].  A 7-French sheath was inserted.  Jonathan identical procedure was performed in Jonathan right popliteal fossa.  Bilateral lower extremity venography was performed.  This confirmed diffuse DVT in Jonathan right lower extremity Jonathan occlusive thrombus in Jonathan left common iliac vein.  A vertebral catheter was then advanced over a Benson wire across Jonathan left common iliac vein occlusion, into Jonathan IVC, Jonathan above Jonathan IVC filter.  It was removed over an exchange length Amplatz wire. Jonathan identical procedure was performed from Jonathan right popliteal vein.  90 cm length infusion catheters were then inserted.  A 20-cm infusion length was placed across Jonathan left common iliac vein occlusion.  A 50 cm infusion length catheter was placed from Jonathan right femoral vein to Jonathan IVC.  TNK was then instituted at 0.25 mg per hour per catheter.  Peripheral heparin was instituted.  Sheaths were sewn in place Jonathan capped open with a sterile normal saline K A V L.  Findings: Left lower extremity venography demonstrates occlusive DVT in Jonathan left common iliac veins Jonathan IVC to Jonathan level of Jonathan IVC filter.  Right lower extremity venography demonstrates massive DVT throughout Jonathan right femoral vein, popliteal vein, common femoral vein, Jonathan iliac venous system.  Complications: None.  IMPRESSION: Venography confirms bilateral DVT.  Infusion was instituted via bilateral popliteal fossa approach with a total of 0.5 mg TNK per hour.   Original Report Authenticated By: Jolaine Click, M.D.    Ir Radiologist Eval & Mgmt  01/13/2013  *RADIOLOGY REPORT*  Chief Complaint:  4  week follow-up visit post bilateral lower extremity venous lysis procedure.  History of Present Illness:  This is a very pleasant 62 year old Ellison who presents to [facility] Radiology Clinic 4 weeks following completion of catheter directed venous lysis procedure performed at [facility] from 12/25/2012 to 12/27/2012 for extensive occlusive bilateral lower extremity Jonathan IVC thrombosis.  Jonathan patient states  Jonathan right lower extremity is much improved since undergoing Jonathan venous lysis procedure though does admit to transient right thigh pain.  He states he has had residual swelling Jonathan pain at Jonathan left popliteal access site.  Jonathan patient also reports mild asymmetric left lower leg swelling, worse at Jonathan end of Jonathan day.  Overall, Jonathan patient states his symptoms have substantially improved in Jonathan subsequent 7 to 10 days.  Jonathan patient has continued anticoagulation with Xarelto Jonathan has been compliant with wearing bilateral lower extremity stockings during waking hours.  Denies chest pain, shortness of breath or dyspnea with exertion.  Jonathan patient does admit to left lower extremity pain during ambulation.  Physical Examination:  Vital Signs:  Blood pressure - 127/75; heart rate - 82; respiratory rate - 16, 99 percent on room air; temperature - 97.8.  General:  This is a well appearing Ellison in no acute distress who appears his stated age. Cardiovascular:  Regular rate Jonathan rhythm. Pulmonary:  Clear to auscultation bilaterally. Extremities:  There is mild (2+) edema within Jonathan left calf.  Jonathan left knee is diffusely swollen in comparison to Jonathan right.  Jonathan left popliteal fossa is mildly tender to palpation.  Jonathan right lower extremity is normal.  Imaging:  Bilateral lower extremity venous Doppler ultrasound performed earlier same day is negative for acute or chronic deep venous thrombosis within either lower extremity.  Selected images from Jonathan bilateral lower extremity there directed venous lysis procedure were  reviewed with Jonathan patient.  Assessment Jonathan Plan:  This is a very pleasant 61 year old Ellison who underwent bilateral lower extremity catheter directed venous lysis for extensive bilateral lower extremity Jonathan IVC thrombosis. While Jonathan right lower extremity has nearly returned to baseline, Jonathan patient reports persistent slightly asymmetric left popliteal fossa Jonathan left lower leg swelling Jonathan pain, however bilateral lower extremity venous Doppler performed today was negative for residual or acute DVT.  Continued lifelong compliance with anticoagulation was stressed to Jonathan patient.  Jonathan patient was encouraged to follow-up with hematology (Dr. Arbutus Ped) for further workup of potential underlying coagulopathy.  Given extensive occlusive bilateral lower extremity DVT Jonathan caval thrombosis, Jonathan patient is not currently a candidate for IVC filter retrieval.  Jonathan patient was encouraged to maintain compliance wearing bilateral lower extremity venous stockings during waking hours.    Jonathan patient was encouraged to increase activity level (especially ambulation) especially in light of no recurrent lower extremity DVT.  Jonathan patient may follow-up to [facility] on a prn basis.   Original Report Authenticated By: Tacey Ruiz, MD    Ir Infusion Thrombol Venous Initial (ms)  12/26/2012  *RADIOLOGY REPORT*  Clinical Data/Indication: PLEGMASIA OF Jonathan RIGHT LOWER EXTREMITY. MASSIVE RIGHT LOWER EXTREMITY DVT.  LEFT COMMON ILIAC VEIN DVT.  RADIOLOGY EXAMINATION,IR ULTRASOUND GUIDANCE VASC ACCESS LEFT,IR ULTRASOUND GUIDANCE VASC ACCESS RIGHT,BILATERAL EXTREMITY VENOGRAPHY  Sedation: Versed zero mg, Fentanyl 100 mg.  Total Moderate Sedation Time: 60 minutes.  Contrast Volume: 60 ml Omnipaque-300.  Additional Medications: Morphine 2 mg.  Fluoroscopy Time: 5.7 minutes.  Procedure: Jonathan procedure, risks, benefits, Jonathan alternatives were explained to Jonathan patient. Questions regarding Jonathan procedure were encouraged Jonathan  answered. Jonathan patient understands Jonathan consents to Jonathan procedure.  Jonathan bilateral popliteal fossas in Jonathan prone position were prepped with betadine in a sterile fashion, Jonathan a sterile drape was applied covering Jonathan operative field. A sterile gown Jonathan sterile gloves were used for Jonathan procedure.  Under sonographic guidance, a micropuncture needle was inserted  into Jonathan left popliteal vein Jonathan removed over a 018 wire which was upsized to a [place].  A 7-French sheath was inserted.  Jonathan identical procedure was performed in Jonathan right popliteal fossa.  Bilateral lower extremity venography was performed.  This confirmed diffuse DVT in Jonathan right lower extremity Jonathan occlusive thrombus in Jonathan left common iliac vein.  A vertebral catheter was then advanced over a Benson wire across Jonathan left common iliac vein occlusion, into Jonathan IVC, Jonathan above Jonathan IVC filter.  It was removed over an exchange length Amplatz wire. Jonathan identical procedure was performed from Jonathan right popliteal vein.  90 cm length infusion catheters were then inserted.  A 20-cm infusion length was placed across Jonathan left common iliac vein occlusion.  A 50 cm infusion length catheter was placed from Jonathan right femoral vein to Jonathan IVC.  TNK was then instituted at 0.25 mg per hour per catheter.  Peripheral heparin was instituted.  Sheaths were sewn in place Jonathan capped open with a sterile normal saline [facility].  Findings: Left lower extremity venography demonstrates occlusive DVT in Jonathan left common iliac veins Jonathan IVC to Jonathan level of Jonathan IVC filter.  Right lower extremity venography demonstrates massive DVT throughout Jonathan right femoral vein, popliteal vein, common femoral vein, Jonathan iliac venous system.  Complications: None.  IMPRESSION: Venography confirms bilateral DVT.  Infusion was instituted via bilateral popliteal fossa approach with a total of 0.5 mg TNK per hour.   Original Report Authenticated By: Jolaine Click, M.D.    Ir Rande Lawman F/u Eval Art/ven Basil Dess Day  (ms)  12/26/2012  *RADIOLOGY REPORT*  Clinical Data/Indication: MASSIVE LOWER EXTREMITY DVT  BILATERAL LOWER EXTREMITY VENOUS LYSIS CHECK POST 20 HOURS.  Fluoroscopy Time: 1.0 minutes.  Procedure: Jonathan procedure, risks, benefits, Jonathan alternatives were explained to Jonathan patient. Questions regarding Jonathan procedure were encouraged Jonathan answered. Jonathan patient understands Jonathan consents to Jonathan procedure.  Contrast was injected into both lower extremities for bilateral lower extremity venography.  Findings: Left lower extremity venography demonstrates maintained patency throughout Jonathan left thigh Jonathan external iliac vein.  There has been an estimated 25% reduction of Jonathan thrombus in Jonathan left common iliac vein extending into Jonathan IVC.  Nearly occlusive thrombus in Jonathan IVC persists.  Right lower extremity venography demonstrates near complete resolution of thrombus in Jonathan right femoral venous system.  There is some improvement within Jonathan occlusive thrombus in Jonathan right common Jonathan external iliac veins.  Approximately 10% thrombus reduction has occurred.  There is persistent nearly occlusive thrombus within Jonathan IVC trapped within Jonathan IVC filter.  Complications: None.  IMPRESSION: After 20 hours of lysis, there has been significant improvement. Lysis for additional 24 hours will be performed.   Original Report Authenticated By: Jolaine Click, M.D.    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  History of IVC filter placement (at least 2 years ago) at outside facility.  BILATERAL LOWER EXTREMITY VENOUS LYSIS CHECK - FINAL DAY  Comparisons: Bilateral lower extremity venous lysis check dash 12/26/2012; ultrasound Jonathan fluoroscopic guided infusion catheter placement - 12/25/2012  Intravenous Medications: None  Contrast: 70 ml Omnipaque-300  Fluoroscopy Time: 1.7 minutes.  Complications: None immediate  Technique:   Informed written consent was obtained from Jonathan patient after a discussion of Jonathan risks, benefits Jonathan alternatives to treatment. Questions regarding Jonathan procedure were encouraged Jonathan answered.  A timeout was performed prior to Jonathan initiation of Jonathan procedure.  Jonathan patient was placed prone on Jonathan fluoroscopy table Jonathan Jonathan external portion of Jonathan existing bilateral popliteal approach vascular sheathes Jonathan infusion catheters as well as Jonathan surrounding skin were prepped Jonathan draped in Jonathan usual sterile fashion, Jonathan a sterile drape was applied covering Jonathan operative field.  Maximum barrier sterile technique with sterile gowns Jonathan gloves were used for Jonathan procedure.  Initial venogram was performed through Jonathan left lower extremity approach infusion catheter.  Subsequent left lower extremity venograms were performed through Jonathan side arm of Jonathan existing left popliteal vein vascular sheath.  Right lower extremity venograms were performed at multiple stations via Jonathan side arm of Jonathan existing contralateral right lower extremity popliteal vein approach vascular sheath.  Given suboptimal opacification of Jonathan right-sided pelvic vasculature, Jonathan right lower extremity approach infusion catheter was exchanged for a vertebral catheter over a guidewire.  Jonathan vertebral catheter was positioned at Jonathan level of Jonathan peripheral aspect of Jonathan right external iliac vein.  A repeat right lower extremity central venogram was performed from this more central location.  Images were reviewed Jonathan Jonathan lysis was completed.  All catheters Jonathan sheaths were removed Jonathan hemostasis was achieved at Jonathan bilateral popliteal access sitees with manual compression. Dressings were placed.  Jonathan patient tolerated procedure well without immediate postprocedural complication.  Findings:  Left lower extremity: Jonathan left common Jonathan external iliac veins are widely patent without evidence of stenosis.  There is a very minimal amount of nonocclusive DVT within Jonathan distal  aspect of Jonathan left superficial femoral vein a component of which is likely associated with Jonathan popliteal vein approach vascular sheath Jonathan infusion catheter.  Slightly sluggish flow is noted through Jonathan left lower extremity, however there is restored antegrade flow without filling of venous collaterals.  Right lower extremity:  Jonathan right common Jonathan external iliac veins are widely patent without evidence of stenosis.  There is a minimal amount of likely chronic nonocclusive DVT within Jonathan distal aspect of Jonathan right superficial femoral vein regional to Jonathan abductor canal.  There is minimal filling of several small venous collaterals at this location.  Jonathan remainder of Jonathan right superficial femoral vein is widely patent to Jonathan level of Jonathan pelvis.   Slightly sluggish antegrade flow is noted throughout Jonathan right lower extremity.  IVC: There is a minimal amount of residual nonocclusive thrombus within Jonathan infrarenal IVC filter.  Jonathan IVC is otherwise patent with restoration of antegrade flow.  Impression:  1.  Technically successful bilateral lower extremity venous lysis with restoration of antegrade flow throughout Jonathan bilateral lower extremities Jonathan through Jonathan IVC. 2.  Minimal amount of nonocclusive DVT remains within Jonathan IVC filter. 3.  Minimal amount of nonocclusive DVT within Jonathan distal aspects of Jonathan bilateral superficial femoral veins, Jonathan right side of which is felt to likely be chronic in etiology.  Plan:  Given concern for patient possibly developing HIT, Jonathan patient will be transitioned from heparin drip to Lovenox injections.  Referral will be made to Hematology for coagulopathy workup Jonathan management of anticoagulation.  In Jonathan meantime, Jonathan patient will be started on Coumadin.  Jonathan patient will be fitted with stockings prior to discharge.  Jonathan patient will followup with Dr. Bonnielee Haff at interventional radiology Clinic [phone]) within 2 to 4 weeks after discharge.  Above findings discussed with Dr. Darnelle Catalan at Jonathan  time of procedure completion.   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 Jonathan first one happened after cholecystectomy Jonathan Jonathan second one started below Jonathan IVC filter. He underwent thrombolytic therapy Jonathan currently on treatment with Xarelto Jonathan feeling fine.   PLAN: I discussed Jonathan lab result with Jonathan patient today. I recommended for him to continue on treatment with Xarelto with routine follow up visit with his primary care physician. Although Jonathan patient has no significant hypercoagulable abnormality but he has a risk for recurrent deep venous thrombosis based on his recent history. I recommended for him to continue on Xarelto for life Jonathan he understands that he will be at a higher risk of bleeding. Jonathan patient agreed with Jonathan current plan. He would follow up with me on as-needed basis.  All questions were answered. Jonathan patient knows to call Jonathan clinic with any problems, questions or concerns. We can certainly see Jonathan patient much sooner if necessary.

## 2013-01-17 NOTE — Progress Notes (Signed)
Follow up. Per note no lab is needed. Checked in patient for dr visit.

## 2013-02-14 ENCOUNTER — Telehealth: Payer: Self-pay | Admitting: Emergency Medicine

## 2013-02-14 ENCOUNTER — Other Ambulatory Visit: Payer: Self-pay | Admitting: Emergency Medicine

## 2013-02-14 NOTE — Telephone Encounter (Signed)
CALLED IN XARELTO 20 MG TABS 1 PO QD 30 TABS X5 REFILLS, SPOKE WITH CARRECE.

## 2013-02-14 NOTE — Telephone Encounter (Signed)
RETURNED CALL TO PT (HE LMOVM HERE AT 1304PM)  C/C OF LLE SWELLING AND PAIN AT NIGHT AFTER WORKING ON FEET ALL DAY.  HE DOES ELEVATE HIS LEGS.  RLE IS FINE.  WILL THIS BE A LIFE LONG ISSUE TO DEAL WITH??  WHO REFILLS MY XARELTO?   CALLED DR HOSS AT 1450PM- THIS MAY BE A CHRONIC PROBLEM SINCE THIS WAS A MASSIVE DVT AND COULD HAVE BEEN WORSE.  TAKES THE BODY A WHILE TO READJUST.  IF HE IS CONCERNED DR HOSS WILL BE HAPPY TO SEE HIM IN CLINIC TO ADDRESS HIS CONCERNS.  DR HOSS WILL BE HAPPY TO REFILL HIS XARELTO FOR , HOWEVER, HE WILL NEED TO ESTABLISH A PRIMARY CARE PHYSICIAN.    1503- CALLED PT BACK WITH THE ABOVE INFO., AND WILL CALL IN A REFILL OF XARELTO TO [facility] IN [facility] ALSO STRESSED TO PT TO OBTAIN PRIMARY CARE DOC AND IF SYMPTOMS BECOME WORSE IN LLE TO CALL us IMMEDIATELY.   Jari Sportsman, EMT 02/14/2013 3:07 PM

## 2013-05-24 ENCOUNTER — Ambulatory Visit (INDEPENDENT_AMBULATORY_CARE_PROVIDER_SITE_OTHER): Payer: BC Managed Care – PPO | Admitting: Internal Medicine

## 2013-05-24 ENCOUNTER — Encounter: Payer: Self-pay | Admitting: Internal Medicine

## 2013-05-24 VITALS — BP 112/80 | HR 67 | Temp 97.7°F | Ht 68.0 in | Wt 166.8 lb

## 2013-05-24 DIAGNOSIS — I80299 Phlebitis and thrombophlebitis of other deep vessels of unspecified lower extremity: Secondary | ICD-10-CM

## 2013-05-24 DIAGNOSIS — I80201 Phlebitis and thrombophlebitis of unspecified deep vessels of right lower extremity: Secondary | ICD-10-CM

## 2013-05-24 DIAGNOSIS — D649 Anemia, unspecified: Secondary | ICD-10-CM

## 2013-05-24 DIAGNOSIS — Z86718 Personal history of other venous thrombosis and embolism: Secondary | ICD-10-CM

## 2013-05-24 LAB — CBC WITH DIFFERENTIAL/PLATELET
Eosinophils Relative: 5.3 % — ABNORMAL HIGH (ref 0.0–5.0)
HCT: 42.2 % (ref 39.0–52.0)
Monocytes Relative: 9.2 % (ref 3.0–12.0)
Neutrophils Relative %: 62.5 % (ref 43.0–77.0)
Platelets: 179 10*3/uL (ref 150.0–400.0)
RBC: 4.54 Mil/uL (ref 4.22–5.81)
WBC: 4.4 10*3/uL — ABNORMAL LOW (ref 4.5–10.5)

## 2013-05-24 LAB — BASIC METABOLIC PANEL
BUN: 14 mg/dL (ref 6–23)
Creatinine, Ser: 1.1 mg/dL (ref 0.4–1.5)
GFR: 70.22 mL/min (ref >60.00)
Potassium: 3.6 mEq/L (ref 3.5–5.1)

## 2013-05-24 LAB — IRON: Iron: 106 ug/dL (ref 42–165)

## 2013-05-24 LAB — FERRITIN: Ferritin: 74.4 ng/mL (ref 22.0–322.0)

## 2013-05-24 MED ORDER — RIVAROXABAN 20 MG PO TABS: 20.0000 mg | ORAL_TABLET | Freq: Every day | ORAL | Status: DC

## 2013-05-24 NOTE — Assessment & Plan Note (Signed)
Occasional pain and swelling- pain at  the R lower extremity, on exam today he has good pulses and no swelling. Recommended Leg elevation, Tylenol as needed, compression stockings if necessary

## 2013-05-24 NOTE — Assessment & Plan Note (Addendum)
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 Dr. Arbutus Ped eval the pt, was not found to have a coagulopathy but recommended on xarelto indefinitely. Plan: Continue xarelto

## 2013-05-24 NOTE — Assessment & Plan Note (Signed)
Labs from few  months ago show anemia and increased creatinine. Repeat labs

## 2013-05-24 NOTE — Progress Notes (Signed)
  Subjective:    Patient ID: Jonathan Ellison, male    DOB: Jul 27, 1951, 62 y.o.   MRN: [id number]  HPI New patient, needs a PCP Patient has a history of DVT, PE, on xarelto x life, chart reviewed and summarized in the assessment and plan. On chart review, he was also noted to have anemia and hyponatremia.  Past Medical History  Diagnosis Date  . Kidney stones 2012  . Pulmonary embolus 2012    DVT-PE , IVC filter after kidney procedure , coumadin x 6 months  . DVT (deep venous thrombosis) 12-2012    large DVT, xarelto x life   . S/P IVC filter 2012   Past Surgical History  Procedure Laterality Date  . Cholecystectomy  2010  . Rotator cuff surgery Right 2008  . Abdominal surgery  2010    2 days after GB   History   Social History  . Marital Status: Married    Spouse Name: Alice    Number of Children: 3  . Years of Education: N/A   Occupational History  . used to works PT    Social History Main Topics  . Smoking status: Former Games developer  . Smokeless tobacco: Never Used     Comment: Quit smoking 30 years ago.  . Alcohol Use: Yes     Comment: socially  . Drug Use: No  . Sexually Active: Not on file   Other Topics Concern  . Not on file   Social History Narrative   Married.  Independent of ADLs and with ambulation.   Family History  Problem Relation Age of Onset  . Cancer Mother     Pancreatic  . Cancer Brother     Head and neck  . Dementia Father   . CAD Neg Hx   . Diabetes      mother?  . Colon cancer Neg Hx   . Prostate cancer Neg Hx      Review of Systems In general feeling very well, the only medication he is taking is xarelto Denies chest pain, shortness or breath. No blood in the urine or in the stools. From time to time he has swelling at the right leg, around the knee, occasional knee pain as well.     Objective:   Physical Exam BP 112/80  Pulse 67  Temp(Src) 97.7 F (36.5 C) (Oral)  Ht 5\' 8"  (1.727 m)  Wt 166 lb 12.8 oz (75.66 kg)  BMI  25.37 kg/m2  SpO2 97%  General -- alert, well-developed, NAD Lungs -- normal respiratory effort, no intercostal retractions, no accessory muscle use, and normal breath sounds.   Heart-- normal rate, regular rhythm, no murmur, and no gallop.   Abdomen--soft, non-tender, no distention Extremities--  Good femoral and pedal pulses B, normal capillary refill all toes  No pitting edema Few Varicose veins without phlebitis, right leg Neurologic-- alert & oriented X3 and strength normal in all extremities. Psych-- Cognition and judgment appear intact. Alert and cooperative with normal attention span and concentration.  not anxious appearing and not depressed appearing.       Assessment & Plan:  Today , I spent more than 20 min with the patient, >50% of the time counseling, and   reviewing the chart

## 2013-05-24 NOTE — Patient Instructions (Addendum)
Next visit in 3months, fasting for a physical exam . Please make an appointment

## 2013-05-26 ENCOUNTER — Encounter: Payer: Self-pay | Admitting: *Deleted

## 2013-08-24 ENCOUNTER — Encounter: Payer: BC Managed Care – PPO | Admitting: Internal Medicine

## 2013-09-12 ENCOUNTER — Encounter: Payer: Self-pay | Admitting: Internal Medicine

## 2013-09-12 ENCOUNTER — Ambulatory Visit (INDEPENDENT_AMBULATORY_CARE_PROVIDER_SITE_OTHER): Payer: BC Managed Care – PPO | Admitting: Internal Medicine

## 2013-09-12 VITALS — BP 134/77 | HR 67 | Temp 97.9°F | Ht 67.2 in | Wt 166.0 lb

## 2013-09-12 DIAGNOSIS — Z23 Encounter for immunization: Secondary | ICD-10-CM

## 2013-09-12 DIAGNOSIS — M79609 Pain in unspecified limb: Secondary | ICD-10-CM

## 2013-09-12 NOTE — Patient Instructions (Signed)
Please Reschedule your physical exam. Take Tylenol 500 mg OTC 2 tablets every 6 hours as needed, call anytime if the pain is worse or is not gradually improving

## 2013-09-12 NOTE — Progress Notes (Signed)
Pre visit review using our clinic review tool, if applicable. No additional management support is needed unless otherwise documented below in the visit note. 

## 2013-09-12 NOTE — Assessment & Plan Note (Signed)
He reports 3 type of R leg pains: --pain at inner thigh,  likely a musculoskeletal issue. -- pain at the whole right leg, usually positional ie after driving x  several hours, Related to a neuropathy-radiculopathy? He does not have back pain, neurological exam is normal. -- Knee pain per se, no evidence of effusion, sx going on since the DVT. Plan:  Tylenol for inner leg pain. Numbness leg and knee pain-We discussed referral to ortho vs observation, elected observation

## 2013-09-12 NOTE — Progress Notes (Signed)
   Subjective:    Patient ID: Jonathan Ellison, male    DOB: January 29, 1951, 62 y.o.   MRN: [id number]  HPI Likes to postpone his complete physical exam, prefers to talk about his right leg: Right knee "still swelling" 3 weeks history of pain in the right groin, mild swelling? No rash. Pain is worse when he tried to spread his legs apart. Also on and off numbness and pain in the entire right leg, mostly after he sits down for a prolonged period of time like driving for few hours.  Past Medical History  Diagnosis Date  . Kidney stones 2012  . Pulmonary embolus 2012    DVT-PE , IVC filter after kidney procedure , coumadin x 6 months  . DVT (deep venous thrombosis) 12-2012    large DVT, xarelto x life   . S/P IVC filter 2012   Past Surgical History  Procedure Laterality Date  . Cholecystectomy  2010  . Rotator cuff surgery Right 2008  . Abdominal surgery  2010    2 days after GB      Review of Systems Denies any testicle pain or swelling in the GU area. He does have mild back pain, but is now worse. No bladder or bowel incontinence. No actual tingling at the LE s. Denies fever or chills.      Objective:   Physical Exam BP 134/77  Pulse 67  Temp(Src) 97.9 F (36.6 C)  Ht 5' 7.2" (1.707 m)  Wt 166 lb (75.297 kg)  BMI 25.84 kg/m2  SpO2 100% General -- alert, well-developed, NAD.   Abdomen-- Not distended, good bowel sounds,soft, non-tender.no Inguinal area. GU-- Scrotal contents normal, penis normal Extremities-- no pretibial edema bilaterally. Good femoral pulses bilaterally. Slightly tender in the inner right thigh, no swelling, rash, mass.  Knees symmetric, no effusion, redness or swelling. Calves symmetric. Neurologic--  alert & oriented X3. Speech normal, gait normal, strength normal in all extremities.  DTRs symmetric  Psych-- Cognition and judgment appear intact. Cooperative with normal attention span and concentration. No anxious appearing , no depressed appearing.        Assessment & Plan:

## 2013-10-18 ENCOUNTER — Encounter: Payer: BC Managed Care – PPO | Admitting: Internal Medicine

## 2014-01-17 ENCOUNTER — Telehealth: Payer: Self-pay | Admitting: Internal Medicine

## 2014-01-17 MED ORDER — RIVAROXABAN 20 MG PO TABS: 20.0000 mg | ORAL_TABLET | Freq: Every day | ORAL | Status: DC

## 2014-01-17 NOTE — Telephone Encounter (Signed)
rx sent. Done

## 2014-01-17 NOTE — Telephone Encounter (Signed)
Patient called and requested refills for Rivaroxaban (XARELTO) 20 MG TABS tablet  [place]

## 2014-01-18 ENCOUNTER — Telehealth: Payer: Self-pay | Admitting: Internal Medicine

## 2014-01-18 NOTE — Telephone Encounter (Signed)
Patient called and stated that his xaban Alveda Reasons) 20 MG TABS tablet needs prior authorization   [place] [phone]

## 2014-01-18 NOTE — Telephone Encounter (Signed)
Called and spoke with representative for [facility] at [phone] concerning prior authoriztion for Xarelto. Prior authorization approved for 3 years. Confirmation letter will be faxed to our office. JG//CMA  Received approval letter via fax from [facility]. Letter sent to be scanned. JG//CMA

## 2014-10-02 ENCOUNTER — Telehealth: Payer: Self-pay

## 2014-10-02 ENCOUNTER — Other Ambulatory Visit: Payer: Self-pay

## 2014-10-02 MED ORDER — RIVAROXABAN 20 MG PO TABS: 20.0000 mg | ORAL_TABLET | Freq: Every day | ORAL | Status: DC

## 2014-10-02 NOTE — Telephone Encounter (Signed)
Xarelto refilled for 30 days, Pt needs appt for any further refills. He has not been seen since 09/2013.

## 2014-10-02 NOTE — Telephone Encounter (Signed)
Jonathan Ellison  [facility] needs a refill on his Rivaroxaban (XARELTO) 20 MG TABS tablet

## 2014-10-02 NOTE — Telephone Encounter (Signed)
LM to let patient know RX had been refilled and he needs an appointment to get anymore refills

## 2014-10-04 ENCOUNTER — Other Ambulatory Visit: Payer: Self-pay

## 2014-10-04 MED ORDER — RIVAROXABAN 20 MG PO TABS: 20.0000 mg | ORAL_TABLET | Freq: Every day | ORAL | Status: DC

## 2014-10-04 NOTE — Telephone Encounter (Signed)
Pt would like rx to go to [facility], please resend

## 2014-10-04 NOTE — Telephone Encounter (Signed)
Resent to [place], no further refills without office visit with Dr. Larose Kells.

## 2014-10-13 DIAGNOSIS — S2239XA Fracture of one rib, unspecified side, initial encounter for closed fracture: Secondary | ICD-10-CM

## 2014-10-13 HISTORY — DX: Fracture of one rib, unspecified side, initial encounter for closed fracture: S22.39XA

## 2014-10-25 ENCOUNTER — Ambulatory Visit (INDEPENDENT_AMBULATORY_CARE_PROVIDER_SITE_OTHER): Payer: Commercial Managed Care - HMO | Admitting: Internal Medicine

## 2014-10-25 ENCOUNTER — Encounter: Payer: Self-pay | Admitting: Internal Medicine

## 2014-10-25 VITALS — BP 126/75 | HR 71 | Temp 97.6°F | Ht 67.0 in | Wt 165.0 lb

## 2014-10-25 DIAGNOSIS — D696 Thrombocytopenia, unspecified: Secondary | ICD-10-CM

## 2014-10-25 DIAGNOSIS — N528 Other male erectile dysfunction: Secondary | ICD-10-CM

## 2014-10-25 DIAGNOSIS — Z86718 Personal history of other venous thrombosis and embolism: Secondary | ICD-10-CM

## 2014-10-25 DIAGNOSIS — N529 Male erectile dysfunction, unspecified: Secondary | ICD-10-CM | POA: Insufficient documentation

## 2014-10-25 MED ORDER — RIVAROXABAN 20 MG PO TABS: 20.0000 mg | ORAL_TABLET | Freq: Every day | ORAL | Status: DC

## 2014-10-25 MED ORDER — SILDENAFIL CITRATE 100 MG PO TABS: 50.0000 mg | ORAL_TABLET | Freq: Every day | ORAL | Status: DC | PRN

## 2014-10-25 NOTE — Assessment & Plan Note (Signed)
Seems to be doing well, on Xarelto for life, no apparent complications. Will check labs.

## 2014-10-25 NOTE — Progress Notes (Signed)
   Subjective:    Patient ID: Jonathan Ellison, male    DOB: August 15, 1951, 64 y.o.   MRN: [id number]  DOS:  10/25/2014 Type of visit - description : rov Interval history: Needs Xarelto refill, in general doing well.  Occasional problems with erectile dysfunction, is Viagra okay?  ROS Denies chest pain, difficulty breathing. Had lower extremity pain and edema, symptoms much improved, occasionally has problems, use the compression stockings as needed and he feels better. Denies any blood in the stools or in the urine  Past Medical History  Diagnosis Date  . Kidney stones 2012  . Pulmonary embolus 2012    DVT-PE , IVC filter after kidney procedure , coumadin x 6 months  . DVT (deep venous thrombosis) 12-2012    large DVT, xarelto x life   . S/P IVC filter 2012    Past Surgical History  Procedure Laterality Date  . Cholecystectomy  2010  . Rotator cuff surgery Right 2008  . Abdominal surgery  2010    2 days after GB    History   Social History  . Marital Status: Married    Spouse Name: Alice    Number of Children: 3  . Years of Education: N/A   Occupational History  . used to works PT    Social History Main Topics  . Smoking status: Former Research scientist (life sciences)  . Smokeless tobacco: Never Used     Comment: Quit smoking 30 years ago.  . Alcohol Use: Yes     Comment: socially  . Drug Use: No  . Sexual Activity: Not on file   Other Topics Concern  . Not on file   Social History Narrative   Married.  Independent of ADLs and with ambulation.        Medication List       This list is accurate as of: 10/25/14 11:59 PM.  Always use your most recent med list.               Medical Compression Stockings Misc  1 each by Does not apply route once.     rivaroxaban 20 MG Tabs tablet  Commonly known as:  XARELTO  Take 1 tablet (20 mg total) by mouth daily.     sildenafil 100 MG tablet  Commonly known as:  VIAGRA  Take 0.5-1 tablets (50-100 mg total) by mouth daily as needed for  erectile dysfunction.           Objective:   Physical Exam BP 126/75 mmHg  Pulse 71  Temp(Src) 97.6 F (36.4 C) (Oral)  Ht 5\' 7"  (1.702 m)  Wt 165 lb (74.844 kg)  BMI 25.84 kg/m2  SpO2 97% General -- alert, well-developed, NAD.   Lungs -- normal respiratory effort, no intercostal retractions, no accessory muscle use, and normal breath sounds.  Heart-- normal rate, regular rhythm, no murmur.  extremities-- no pretibial edema bilaterally  Neurologic--  alert & oriented X3.   Psych-- Cognition and judgment appear intact. Cooperative with normal attention span and concentration. No anxious or depressed appearing.     Assessment & Plan:  declined a flu shot Encourage to come back for a physical

## 2014-10-25 NOTE — Assessment & Plan Note (Signed)
Check labs 

## 2014-10-25 NOTE — Patient Instructions (Signed)
Get your blood work before you leave   Please come back to the office at your convenience for a physical exam.

## 2014-10-25 NOTE — Assessment & Plan Note (Signed)
occ probs with ED, would like to try Viagra, I see no contraindication, requested a prescription which is provided. No interactions with Xarelto

## 2014-10-25 NOTE — Progress Notes (Signed)
Pre visit review using our clinic review tool, if applicable. No additional management support is needed unless otherwise documented below in the visit note. 

## 2014-10-26 LAB — CBC WITH DIFFERENTIAL/PLATELET
Basophils Absolute: 0 10*3/uL (ref 0.0–0.1)
Basophils Relative: 0.7 % (ref 0.0–3.0)
Eosinophils Absolute: 0.3 10*3/uL (ref 0.0–0.7)
Eosinophils Relative: 4.2 % (ref 0.0–5.0)
HEMATOCRIT: 42.9 % (ref 39.0–52.0)
Hemoglobin: 14.5 g/dL (ref 13.0–17.0)
Lymphocytes Relative: 21.1 % (ref 12.0–46.0)
Lymphs Abs: 1.3 10*3/uL (ref 0.7–4.0)
MCHC: 33.7 g/dL (ref 30.0–36.0)
MCV: 92.1 fl (ref 78.0–100.0)
MONO ABS: 0.5 10*3/uL (ref 0.1–1.0)
MONOS PCT: 8.4 % (ref 3.0–12.0)
NEUTROS ABS: 3.9 10*3/uL (ref 1.4–7.7)
NEUTROS PCT: 65.6 % (ref 43.0–77.0)
PLATELETS: 228 10*3/uL (ref 150.0–400.0)
RBC: 4.66 Mil/uL (ref 4.22–5.81)
RDW: 12.9 % (ref 11.5–15.5)
WBC: 6 10*3/uL (ref 4.0–10.5)

## 2014-10-26 LAB — COMPREHENSIVE METABOLIC PANEL
ALT: 11 U/L (ref 0–53)
AST: 13 U/L (ref 0–37)
Albumin: 3.9 g/dL (ref 3.5–5.2)
Alkaline Phosphatase: 69 U/L (ref 39–117)
BUN: 24 mg/dL — ABNORMAL HIGH (ref 6–23)
CALCIUM: 8.9 mg/dL (ref 8.4–10.5)
CHLORIDE: 106 meq/L (ref 96–112)
CO2: 29 meq/L (ref 19–32)
CREATININE: 1.25 mg/dL (ref 0.40–1.50)
GFR: 61.59 mL/min (ref >60.00)
GLUCOSE: 92 mg/dL (ref 70–99)
POTASSIUM: 4.2 meq/L (ref 3.5–5.1)
Sodium: 140 mEq/L (ref 135–145)
TOTAL PROTEIN: 7.2 g/dL (ref 6.0–8.3)
Total Bilirubin: 1.1 mg/dL (ref 0.2–1.2)

## 2015-08-24 ENCOUNTER — Encounter: Payer: Self-pay | Admitting: Internal Medicine

## 2015-08-24 ENCOUNTER — Ambulatory Visit (INDEPENDENT_AMBULATORY_CARE_PROVIDER_SITE_OTHER): Payer: Commercial Managed Care - HMO | Admitting: Internal Medicine

## 2015-08-24 VITALS — BP 112/66 | HR 61 | Temp 97.6°F | Ht 67.0 in | Wt 162.0 lb

## 2015-08-24 DIAGNOSIS — Z1159 Encounter for screening for other viral diseases: Secondary | ICD-10-CM

## 2015-08-24 DIAGNOSIS — Z125 Encounter for screening for malignant neoplasm of prostate: Secondary | ICD-10-CM

## 2015-08-24 DIAGNOSIS — Z09 Encounter for follow-up examination after completed treatment for conditions other than malignant neoplasm: Secondary | ICD-10-CM

## 2015-08-24 DIAGNOSIS — Z114 Encounter for screening for human immunodeficiency virus [HIV]: Secondary | ICD-10-CM

## 2015-08-24 DIAGNOSIS — Z0000 Encounter for general adult medical examination without abnormal findings: Secondary | ICD-10-CM | POA: Diagnosis not present

## 2015-08-24 NOTE — Progress Notes (Signed)
Pre visit review using our clinic review tool, if applicable. No additional management support is needed unless otherwise documented below in the visit note. 

## 2015-08-24 NOTE — Patient Instructions (Addendum)
Please schedule labs to be done within few days (fasting)  Please consider visit these websites for more information:  [email or web address]  [email or web address]  Next visit in one year, fasting for a physical exam.    Fall Prevention and Home Safety Falls cause injuries and can affect all age groups. It is possible to use preventive measures to significantly decrease the likelihood of falls. There are many simple measures which can make your home safer and prevent falls. OUTDOORS  Repair cracks and edges of walkways and driveways.  Remove high doorway thresholds.  Trim shrubbery on the main path into your home.  Have good outside lighting.  Clear walkways of tools, rocks, debris, and clutter.  Check that handrails are not broken and are securely fastened. Both sides of steps should have handrails.  Have leaves, snow, and ice cleared regularly.  Use sand or salt on walkways during winter months.  In the garage, clean up grease or oil spills. BATHROOM  Install night lights.  Install grab bars by the toilet and in the tub and shower.  Use non-skid mats or decals in the tub or shower.  Place a plastic non-slip stool in the shower to sit on, if needed.  Keep floors dry and clean up all water on the floor immediately.  Remove soap buildup in the tub or shower on a regular basis.  Secure bath mats with non-slip, double-sided rug tape.  Remove throw rugs and tripping hazards from the floors. BEDROOMS  Install night lights.  Make sure a bedside light is easy to reach.  Do not use oversized bedding.  Keep a telephone by your bedside.  Have a firm chair with side arms to use for getting dressed.  Remove throw rugs and tripping hazards from the floor. KITCHEN  Keep handles on pots and pans turned toward the center of the stove. Use back burners when possible.  Clean up spills quickly and allow time for drying.  Avoid walking on wet  floors.  Avoid hot utensils and knives.  Position shelves so they are not too high or low.  Place commonly used objects within easy reach.  If necessary, use a sturdy step stool with a grab bar when reaching.  Keep electrical cables out of the way.  Do not use floor polish or wax that makes floors slippery. If you must use wax, use non-skid floor wax.  Remove throw rugs and tripping hazards from the floor. STAIRWAYS  Never leave objects on stairs.  Place handrails on both sides of stairways and use them. Fix any loose handrails. Make sure handrails on both sides of the stairways are as long as the stairs.  Check carpeting to make sure it is firmly attached along stairs. Make repairs to worn or loose carpet promptly.  Avoid placing throw rugs at the top or bottom of stairways, or properly secure the rug with carpet tape to prevent slippage. Get rid of throw rugs, if possible.  Have an electrician put in a light switch at the top and bottom of the stairs. OTHER FALL PREVENTION TIPS  Wear low-heel or rubber-soled shoes that are supportive and fit well. Wear closed toe shoes.  When using a stepladder, make sure it is fully opened and both spreaders are firmly locked. Do not climb a closed stepladder.  Add color or contrast paint or tape to grab bars and handrails in your home. Place contrasting color strips on first and last steps.  Learn and use mobility aids as needed. Install an  electrical emergency response system.  Turn on lights to avoid dark areas. Replace light bulbs that burn out immediately. Get light switches that glow.  Arrange furniture to create clear pathways. Keep furniture in the same place.  Firmly attach carpet with non-skid or double-sided tape.  Eliminate uneven floor surfaces.  Select a carpet pattern that does not visually hide the edge of steps.  Be aware of all pets. OTHER HOME SAFETY TIPS  Set the water temperature for 120 F (48.8 C).  Keep  emergency numbers on or near the telephone.  Keep smoke detectors on every level of the home and near sleeping areas. Document Released: 09/19/2002 Document Revised: 03/30/2012 Document Reviewed: 12/19/2011 [facility] Patient Information 2015 [place], [place]. This information is not intended to replace advice given to you by your health care provider. Make sure you discuss any questions you have with your health care provider.   Preventive Care for Adults Ages 79 and over  Blood pressure check.** / Every 1 to 2 years.  Lipid and cholesterol check.**/ Every 5 years beginning at age 67.  Lung cancer screening. / Every year if you are aged 70-80 years and have a 30-pack-year history of smoking and currently smoke or have quit within the past 15 years. Yearly screening is stopped once you have quit smoking for at least 15 years or develop a health problem that would prevent you from having lung cancer treatment.  Fecal occult blood test (FOBT) of stool. / Every year beginning at age 44 and continuing until age 76. You may not have to do this test if you get a colonoscopy every 10 years.  Flexible sigmoidoscopy** or colonoscopy.** / Every 5 years for a flexible sigmoidoscopy or every 10 years for a colonoscopy beginning at age 74 and continuing until age 27.  Hepatitis C blood test.** / For all people born from [birth year] through 1967 and any individual with known risks for hepatitis C.  Abdominal aortic aneurysm (AAA) screening.** / A one-time screening for ages 66 to 23 years who are current or former smokers.  Skin self-exam. / Monthly.  Influenza vaccine. / Every year.  Tetanus, diphtheria, and acellular pertussis (Tdap/Td) vaccine.** / 1 dose of Td every 10 years.  Varicella vaccine.** / Consult your health care provider.  Zoster vaccine.** / 1 dose for adults aged 52 years or older.  Pneumococcal 13-valent conjugate (PCV13) vaccine.** / Consult your health care provider.  Pneumococcal  polysaccharide (PPSV23) vaccine.** / 1 dose for all adults aged 41 years and older.  Meningococcal vaccine.** / Consult your health care provider.  Hepatitis A vaccine.** / Consult your health care provider.  Hepatitis B vaccine.** / Consult your health care provider.  Haemophilus influenzae type b (Hib) vaccine.** / Consult your health care provider. **Family history and personal history of risk and conditions may change your health care provider's recommendations. Document Released: 11/25/2001 Document Revised: 10/04/2013 Document Reviewed: 02/24/2011 [facility] Patient Information 2015 [place], [place]. This information is not intended to replace advice given to you by your health care provider. Make sure you discuss any questions you have with your health care provider.

## 2015-08-24 NOTE — Assessment & Plan Note (Addendum)
Td 2014 Declined a flu shot, pneumonia shot or Zostavax: Pros, cons, benefits discussed. CCS: Reports a normal colonoscopy at age 63 in [place], no records, recommend to let me know where was it done so I can send a ROI Prostate cancer screening: DRE normal, check a PSA. Has a healthy lifestyle, he is extremely active and eat healthy. Labs

## 2015-08-24 NOTE — Progress Notes (Signed)
Subjective:    Patient ID: Jonathan Ellison, male    DOB: 04-21-1951, 64 y.o.   MRN: [id number]  DOS:  08/24/2015 Type of visit - description :  Here for Medicare AWV:  1. Risk factors based on Past M, S, F history: reviewed 2. Physical Activities:  Active at work (part time) 3. Depression/mood: neg screening  4. Hearing:  No problems noted or reported  5. ADL's: independent, drives  6. Fall Risk: no recent falls, prevention discussed , see AVS 7. home Safety: does feel safe at home  8. Height, weight, & visual acuity: see VS,just saw the eye doctor   9. Counseling: provided 10. Labs ordered based on risk factors: if needed  11. Referral Coordination: if needed 12. Care Plan, see assessment and plan , written personalized plan provided , see AVS 13. Cognitive Assessment: motor skills and cognition appropriate for age 77. Care team updated  15. End-of-life care discussed   In addition, today we discussed the following: Hypercoagulation: Good compliance w/ xarelto w/o apparent side effects Chronic leg pain: Symptoms under very good control, feeling better.   Review of Systems  Constitutional: No fever. No chills. No unexplained wt changes. No unusual sweats  HEENT: No dental problems, no ear discharge, no facial swelling, no voice changes. No eye discharge, no eye  redness , no  intolerance to light   Respiratory: No wheezing , no  difficulty breathing. No cough , no mucus production  Cardiovascular: No CP, no leg swelling , no  Palpitations  GI: no nausea, no vomiting, no diarrhea , no  abdominal pain.  No blood in the stools. No dysphagia, no odynophagia    Endocrine: No polyphagia, no polyuria , no polydipsia  GU: No dysuria, gross hematuria, difficulty urinating. No urinary urgency, no frequency.  Musculoskeletal: No joint swellings or unusual aches or pains  Skin: No change in the color of the skin, palor , no  Rash  Allergic, immunologic: No environmental  allergies , no  food allergies  Neurological: No dizziness no  syncope. No headaches. No diplopia, no slurred, no slurred speech, no motor deficits, no facial  Numbness  Hematological: No enlarged lymph nodes, no easy bruising , no unusual bleedings  Psychiatry: No suicidal ideas, no hallucinations, no beavior problems, no confusion.  No unusual/severe anxiety, no depression  Past Medical History  Diagnosis Date  . Kidney stones 2012  . 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 DVT (deep venous thrombosis) ([place]) 12-2012    large DVT, xarelto x life   . S/P IVC filter 2012    Past Surgical History  Procedure Laterality Date  . Cholecystectomy  2012  . Rotator cuff surgery Right 2008  . Abdominal surgery  2012    2 days after GB    Social History   Social History  . Marital Status: Married    Spouse Name: Alice  . Number of Children: 3  . Years of Education: N/A   Occupational History  . 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 Smoking status: Former Research scientist (life sciences)  . Smokeless tobacco: Never Used     Comment: Quit smoking 30 years ago.  . Alcohol Use: Yes     Comment: socially  . Drug Use: No  . Sexual Activity: Not on file   Other Topics Concern  . Not on file   Social  History Narrative   Married.  Independent of ADLs and with ambulation.   Family History  Problem Relation Age of Onset  . Cancer Mother     Pancreatic  . Cancer Brother     Head and neck  . Dementia Father   . CAD Neg Hx   . Diabetes Mother     mother?  . Colon cancer Neg Hx   . Prostate cancer Neg Hx        Medication List       This list is accurate as of: 08/24/15 11:59 PM.  Always use your most recent med list.               rivaroxaban 20 MG Tabs tablet  Commonly known as:  XARELTO  Take 1 tablet (20 mg total) by mouth daily.           Objective:   Physical Exam BP 112/66 mmHg  Pulse 61  Temp(Src)  97.6 F (36.4 C) (Oral)  Ht 5\' 7"  (1.702 m)  Wt 162 lb (73.483 kg)  BMI 25.37 kg/m2  SpO2 99% General:   Well developed, well nourished . NAD.  HEENT:  Normocephalic . Face symmetric, atraumatic Neck: No thyromegaly Lungs:  CTA B Normal respiratory effort, no intercostal retractions, no accessory muscle use. Heart: RRR,  no murmur.  no pretibial edema bilaterally  Abdomen:  Not distended, soft, non-tender. No rebound or rigidity.  Rectal:  External abnormalities: none. Normal sphincter tone. No rectal masses or tenderness.  Stool brown  Prostate: Prostate gland firm and smooth, no enlargement, nodularity, tenderness, mass, asymmetry or induration.  Skin: Not pale. Not jaundice Neurologic:  alert & oriented X3.  Speech normal, gait appropriate for age and unassisted Psych--  Cognition and judgment appear intact.  Cooperative with normal attention span and concentration.  Behavior appropriate. No anxious or depressed appearing.    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

## 2015-08-26 DIAGNOSIS — Z09 Encounter for follow-up examination after completed treatment for conditions other than malignant neoplasm: Secondary | ICD-10-CM | POA: Insufficient documentation

## 2015-08-26 NOTE — Assessment & Plan Note (Signed)
ED: Decided not to take any medication Chronic anticoagulation: Good compliance with medications without apparent side effects Phlegmasia cerulea dolens: Doing well, essentially asymptomatic, good compliance with compression stockings RTC one year

## 2015-08-28 ENCOUNTER — Other Ambulatory Visit: Payer: Commercial Managed Care - HMO

## 2015-08-29 ENCOUNTER — Other Ambulatory Visit (INDEPENDENT_AMBULATORY_CARE_PROVIDER_SITE_OTHER): Payer: Commercial Managed Care - HMO

## 2015-08-29 DIAGNOSIS — Z125 Encounter for screening for malignant neoplasm of prostate: Secondary | ICD-10-CM | POA: Diagnosis not present

## 2015-08-29 DIAGNOSIS — Z0000 Encounter for general adult medical examination without abnormal findings: Secondary | ICD-10-CM | POA: Diagnosis not present

## 2015-08-29 DIAGNOSIS — Z1159 Encounter for screening for other viral diseases: Secondary | ICD-10-CM

## 2015-08-29 DIAGNOSIS — Z114 Encounter for screening for human immunodeficiency virus [HIV]: Secondary | ICD-10-CM

## 2015-08-30 LAB — PSA: PSA: 0.74 ng/mL (ref 0.10–4.00)

## 2015-08-30 LAB — LIPID PANEL
CHOLESTEROL: 157 mg/dL (ref 0–200)
HDL: 59.2 mg/dL (ref >39.00)
LDL Cholesterol: 86 mg/dL (ref 0–99)
NONHDL: 97.32
TRIGLYCERIDES: 56 mg/dL (ref 0.0–149.0)
Total CHOL/HDL Ratio: 3
VLDL: 11.2 mg/dL (ref 0.0–40.0)

## 2015-08-30 LAB — CBC WITH DIFFERENTIAL/PLATELET
BASOS PCT: 0.5 % (ref 0.0–3.0)
Basophils Absolute: 0 10*3/uL (ref 0.0–0.1)
EOS PCT: 3.3 % (ref 0.0–5.0)
Eosinophils Absolute: 0.2 10*3/uL (ref 0.0–0.7)
HCT: 42.8 % (ref 39.0–52.0)
Hemoglobin: 14.6 g/dL (ref 13.0–17.0)
LYMPHS ABS: 1.3 10*3/uL (ref 0.7–4.0)
Lymphocytes Relative: 26 % (ref 12.0–46.0)
MCHC: 34.1 g/dL (ref 30.0–36.0)
MCV: 90.9 fl (ref 78.0–100.0)
MONO ABS: 0.4 10*3/uL (ref 0.1–1.0)
MONOS PCT: 8.5 % (ref 3.0–12.0)
NEUTROS ABS: 3 10*3/uL (ref 1.4–7.7)
NEUTROS PCT: 61.7 % (ref 43.0–77.0)
Platelets: 193 10*3/uL (ref 150.0–400.0)
RBC: 4.71 Mil/uL (ref 4.22–5.81)
RDW: 13.3 % (ref 11.5–15.5)
WBC: 4.9 10*3/uL (ref 4.0–10.5)

## 2015-08-30 LAB — COMPREHENSIVE METABOLIC PANEL
ALT: 14 U/L (ref 0–53)
AST: 13 U/L (ref 0–37)
Albumin: 3.8 g/dL (ref 3.5–5.2)
Alkaline Phosphatase: 75 U/L (ref 39–117)
BUN: 15 mg/dL (ref 6–23)
CHLORIDE: 104 meq/L (ref 96–112)
CO2: 26 meq/L (ref 19–32)
CREATININE: 1.12 mg/dL (ref 0.40–1.50)
Calcium: 9.1 mg/dL (ref 8.4–10.5)
GFR: 69.73 mL/min (ref >60.00)
Glucose, Bld: 91 mg/dL (ref 70–99)
POTASSIUM: 4.5 meq/L (ref 3.5–5.1)
SODIUM: 138 meq/L (ref 135–145)
Total Bilirubin: 1.8 mg/dL — ABNORMAL HIGH (ref 0.2–1.2)
Total Protein: 6.5 g/dL (ref 6.0–8.3)

## 2015-08-30 LAB — HEPATITIS C ANTIBODY: HCV Ab: NEGATIVE

## 2015-08-30 LAB — TSH: TSH: 1.19 u[IU]/mL (ref 0.35–4.50)

## 2015-08-30 LAB — HIV ANTIBODY (ROUTINE TESTING W REFLEX): HIV 1&2 Ab, 4th Generation: NONREACTIVE

## 2015-09-12 ENCOUNTER — Telehealth: Payer: Self-pay | Admitting: *Deleted

## 2015-09-12 NOTE — Telephone Encounter (Signed)
Forwarded to Dr. Paz. JG//CMA 

## 2015-09-18 ENCOUNTER — Telehealth: Payer: Self-pay | Admitting: Internal Medicine

## 2015-09-18 NOTE — Telephone Encounter (Signed)
Please advise 

## 2015-09-18 NOTE — Telephone Encounter (Signed)
Provide 2 weeks of samples. Provide a coupon,it should make it much less expensive. If that is not the case, he may need to go back on Coumadin.

## 2015-09-18 NOTE — Telephone Encounter (Signed)
Caller name: Christy   Relationship to patient: Self   Can be reached: [phone]  Pharmacy: [facility] [place] - [id number]  Reason for call: Pt says that he was told when picking up his script that his medication is now 200.00. Pt says that is to expensive. Pt would like to know if there is an alternative? He is concerned because he has to have this medication. (Xarelto)

## 2015-09-18 NOTE — Telephone Encounter (Signed)
Spoke with Pt, informed him I can give him samples, but I can only give him 15 tablets at this time. He informed me his is in the donut hole and can't afford medication out of pocket right now. Informed him that samples will be placed at front desk for pick up and to call when he needs more samples. Pt verbalized understanding.

## 2015-09-26 NOTE — Telephone Encounter (Signed)
Reviewed 09-14-15

## 2015-11-13 ENCOUNTER — Ambulatory Visit (INDEPENDENT_AMBULATORY_CARE_PROVIDER_SITE_OTHER): Payer: Commercial Managed Care - HMO | Admitting: Internal Medicine

## 2015-11-13 ENCOUNTER — Other Ambulatory Visit (HOSPITAL_BASED_OUTPATIENT_CLINIC_OR_DEPARTMENT_OTHER): Payer: Commercial Managed Care - HMO

## 2015-11-13 ENCOUNTER — Encounter: Payer: Self-pay | Admitting: Internal Medicine

## 2015-11-13 ENCOUNTER — Ambulatory Visit (HOSPITAL_BASED_OUTPATIENT_CLINIC_OR_DEPARTMENT_OTHER)
Admission: RE | Admit: 2015-11-13 | Discharge: 2015-11-13 | Disposition: A | Discharge status: Home / Self Care | Payer: Commercial Managed Care - HMO | Source: Ambulatory Visit | Attending: Internal Medicine | Admitting: Internal Medicine

## 2015-11-13 VITALS — BP 108/76 | HR 75 | Temp 98.1°F | Ht 67.0 in | Wt 158.4 lb

## 2015-11-13 DIAGNOSIS — R0789 Other chest pain: Secondary | ICD-10-CM

## 2015-11-13 DIAGNOSIS — R0602 Shortness of breath: Secondary | ICD-10-CM | POA: Insufficient documentation

## 2015-11-13 DIAGNOSIS — R0600 Dyspnea, unspecified: Secondary | ICD-10-CM | POA: Insufficient documentation

## 2015-11-13 DIAGNOSIS — R05 Cough: Secondary | ICD-10-CM | POA: Insufficient documentation

## 2015-11-13 DIAGNOSIS — Z86718 Personal history of other venous thrombosis and embolism: Secondary | ICD-10-CM | POA: Diagnosis not present

## 2015-11-13 DIAGNOSIS — D71 Functional disorders of polymorphonuclear neutrophils: Secondary | ICD-10-CM | POA: Insufficient documentation

## 2015-11-13 DIAGNOSIS — I779 Disorder of arteries and arterioles, unspecified: Secondary | ICD-10-CM | POA: Diagnosis not present

## 2015-11-13 DIAGNOSIS — L929 Granulomatous disorder of the skin and subcutaneous tissue, unspecified: Secondary | ICD-10-CM | POA: Insufficient documentation

## 2015-11-13 LAB — BASIC METABOLIC PANEL
BUN: 16 mg/dL (ref 6–23)
CO2: 26 meq/L (ref 19–32)
Calcium: 8.8 mg/dL (ref 8.4–10.5)
Chloride: 105 mEq/L (ref 96–112)
Creatinine, Ser: 1.16 mg/dL (ref 0.40–1.50)
GFR: 66.92 mL/min (ref >60.00)
GLUCOSE: 100 mg/dL — AB (ref 70–99)
POTASSIUM: 3.3 meq/L — AB (ref 3.5–5.1)
SODIUM: 138 meq/L (ref 135–145)

## 2015-11-13 MED ORDER — IOHEXOL 350 MG/ML SOLN
100.0000 mL | Freq: Once | INTRAVENOUS | Status: AC | PRN
  Administered 2015-11-13: 100 mL via INTRAVENOUS

## 2015-11-13 MED ORDER — AZITHROMYCIN 250 MG PO TABS: ORAL_TABLET | ORAL | Status: DC

## 2015-11-13 MED ORDER — HYDROCODONE-HOMATROPINE 5-1.5 MG/5ML PO SYRP: 5.0000 mL | ORAL_SOLUTION | Freq: Every evening | ORAL | Status: DC | PRN

## 2015-11-13 NOTE — Patient Instructions (Signed)
Rest, fluids , tylenol  For cough:  Take Mucinex DM twice a day as needed until better If the cough continue, take hydrocodone at nighttime only, will make you drowsy.   For nasal congestion: Use OTC Nasocort or Flonase : 2 nasal sprays on each side of the nose in the morning until you feel better  Take the antibiotic as prescribed , Zithromax  Call if not gradually better over the next  10 days  Call anytime if the symptoms are severe

## 2015-11-13 NOTE — Progress Notes (Signed)
Pre visit review using our clinic review tool, if applicable. No additional management support is needed unless otherwise documented below in the visit note. 

## 2015-11-13 NOTE — Progress Notes (Signed)
Subjective:    Patient ID: Jonathan Ellison, male    DOB: 07/02/1951, 65 y.o.   MRN: [id number]  DOS:  11/13/2015 Type of visit - description : Acute visit Interval history: Patient spent 3 weeks in [facility], working 12 hours a day, flew back to [place] [facility] 11/09/2015, on his way back developed moderate cough, associated with right-sided chest pain. The pain is now  steady and increases with cough or deep breaths. He is on Xarelto, good compliance, pt is concern about a clot.   Review of Systems  He admits to some difficulty breathing since the cough started but no really other symptoms such as fever, chills, sinus pain or congestion. No substernal chest pain, no exertional symptoms. Pain no worse with po intake. + Occasional heartburn No nausea, vomiting, diarrhea No rash No sputum production Mild wheezing?Marland Kitchen No lower extremity but he did have some right calf pain 2 days ago.  Past Medical History  Diagnosis Date  . Kidney stones 2012  . 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 DVT (deep venous thrombosis) ([place]) 12-2012    large DVT, xarelto x life   . S/P IVC filter 2012  . Posthemorrhagic anemia   . Pancreatic benign neoplasm 90s    1.4 cm cystic structure over head of pancreas, present since mid to late 90s  . Hyperbilirubinemia   . Elevation of level of transaminase or lactic acid dehydrogenase (LDH)   . Closed fracture of proximal phalanx of finger     Right index finger  . Open fracture of lateral malleolus 80s    Left  . Rib fracture 2016    Left 9th and 10th rib, fall from ladder, landed on L elbow, shoulder and back, seen at [facility]    Past Surgical History  Procedure Laterality Date  . Cholecystectomy  2012    Laparoscopic  . Rotator cuff surgery Right 2008  . Ercp w/ sphicterotomy  2012    s/p cholestectomy in 2012, Dr. Alonza Bogus  . Umbilical hernia repair      Incarcerated  . Lithotripsy  2007   Dx: Left ESL  . Rotator cuff repair      mid 52's    Social History   Social History  . Marital Status: Married    Spouse Name: Alice  . Number of Children: 3  . Years of Education: N/A   Occupational History  . 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 Smoking status: Former Research scientist (life sciences)  . Smokeless tobacco: Never Used     Comment: Quit smoking 30 years ago.  . Alcohol Use: Yes     Comment: socially  . Drug Use: No  . Sexual Activity: Not on file   Other Topics Concern  . Not on file   Social History Narrative   Married.  Independent of ADLs and with ambulation.        Medication List       This list is accurate as of: 11/13/15 11:59 PM.  Always use your most recent med list.               azithromycin 250 MG tablet  Commonly known as:  ZITHROMAX Z-PAK  2 tabs a day the first day, then 1 tab a day x 4 days     HYDROcodone-homatropine 5-1.5 MG/5ML syrup  Commonly known as:  HYCODAN  Take 5  mLs by mouth at bedtime as needed for cough.     rivaroxaban 20 MG Tabs tablet  Commonly known as:  XARELTO  Take 1 tablet (20 mg total) by mouth daily.           Objective:   Physical Exam  Abdominal:     BP 108/76 mmHg  Pulse 75  Temp(Src) 98.1 F (36.7 C) (Oral)  Ht 5\' 7"  (1.702 m)  Wt 158 lb 6 oz (71.838 kg)  BMI 24.80 kg/m2  SpO2 97% General:   Well developed, well nourished . NAD.  HEENT:  Normocephalic . Face symmetric, atraumatic. Nose not congested, throat symmetric Lungs:  Slightly prolonged expiratory time with cough but no rhonchi, crackles. Normal respiratory effort, no intercostal retractions, no accessory muscle use. Heart: RRR,  no murmur.  no pretibial edema bilaterally . Calves symmetric and not TTP Abdomen:  Not distended, soft, non-tender. No rebound or rigidity.   The right upper quadrant is carefully and deeply palpated: No pain Skin: Not pale. Not jaundice Neurologic:  alert & oriented X3.  Speech normal, gait  appropriate for age and unassisted Psych--  Cognition and judgment appear intact.  Cooperative with normal attention span and concentration.  Behavior appropriate. No anxious or depressed appearing.    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 DDX includes PE, bronchitis, pneumonia, pleurisy, GERD. Other etiologies are less likely. No DVT on clinical grounds. I discuss the situation with hematology, a d-dimer would be skewed thus not a good screening tool. Plan: stat  angiogram of the chest, if negative will treat as bronchitis. Patient in agreement Addendum: CT, see below,  showed no PE, + aortic valvular disease (on today's exam no obvious murmur). Plan:  --Z-Pak, Mucinex, hydrocodone as needed, if he is not improving soon, will call, consider albuterol d/t ? Of wheezing. --Ao disease: Cardiology referral.  IMPRESSION: 1. No evidence of acute pulmonary embolism or other acute chest process. 2. 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 Consider echocardiography. Recommend semi-annual imaging followup by CTA or MRA and referral to cardiothoracic surgery if not already obtained. This recommendation follows 2010 ACCF/AHA/AATS/ACR/ASA/SCA/SCAI/SIR/STS/SVM Guidelines for the Diagnosis and Management of Patients With Thoracic Aortic Disease. Circulation. 2010; 121: [id number] 3. Evidence of prior granulomatous disease with calcified granulomas and lymph nodes bilaterally.

## 2015-11-15 ENCOUNTER — Other Ambulatory Visit: Payer: Self-pay | Admitting: Internal Medicine

## 2015-11-20 ENCOUNTER — Telehealth: Payer: Self-pay | Admitting: Internal Medicine

## 2015-11-20 NOTE — Telephone Encounter (Signed)
New Message  This message is to inform you that we have made 3 consecutive attempts to contact the patient. We have also mailed a letter to the patient to inform them to call in and schedule. Although we were unsuccessful in these attempts we wanted you to be aware of our efforts. Will remove the patient from our work queue at this time.    [facility]

## 2015-11-21 NOTE — Telephone Encounter (Signed)
thx

## 2016-01-01 ENCOUNTER — Encounter: Payer: Self-pay | Admitting: Internal Medicine

## 2016-01-01 ENCOUNTER — Ambulatory Visit (INDEPENDENT_AMBULATORY_CARE_PROVIDER_SITE_OTHER): Payer: Commercial Managed Care - HMO | Admitting: Internal Medicine

## 2016-01-01 VITALS — BP 106/80 | HR 79 | Temp 98.2°F | Ht 67.0 in | Wt 157.4 lb

## 2016-01-01 DIAGNOSIS — I350 Nonrheumatic aortic (valve) stenosis: Secondary | ICD-10-CM | POA: Diagnosis not present

## 2016-01-01 DIAGNOSIS — J0120 Acute ethmoidal sinusitis, unspecified: Secondary | ICD-10-CM | POA: Diagnosis not present

## 2016-01-01 DIAGNOSIS — Z09 Encounter for follow-up examination after completed treatment for conditions other than malignant neoplasm: Secondary | ICD-10-CM

## 2016-01-01 MED ORDER — CEFUROXIME AXETIL 500 MG PO TABS
500.0000 mg | ORAL_TABLET | Freq: Two times a day (BID) | ORAL | Status: DC
Start: 2016-01-01 — End: 2016-08-04

## 2016-01-01 MED ORDER — AZELASTINE HCL 0.1 % NA SOLN: 2.0000 | Freq: Every evening | NASAL | Status: DC | PRN

## 2016-01-01 NOTE — Patient Instructions (Addendum)
Rest, fluids , tylenol  For cough:  Take Mucinex DM twice a day as needed until better  For nasal congestion: Use OTC Nasocort or Flonase : 2 nasal sprays on each side of the nose in the morning until you feel better Use ASTELIN a prescribed spray : 2 nasal sprays on each side of the nose at night until you feel better   Avoid decongestants such as  Pseudoephedrine or phenylephrine    Take the antibiotic as prescribed  (ceftin)  Call if not gradually better over the next  10 days  Call anytime if the symptoms are severe   Come back for a check up in 4 weeks

## 2016-01-01 NOTE — Progress Notes (Signed)
Pre visit review using our clinic review tool, if applicable. No additional management support is needed unless otherwise documented below in the visit note. 

## 2016-01-01 NOTE — Assessment & Plan Note (Signed)
Sinusitis -- Having cough, sinus pain and congestion for the last 4 days, + subjective fever. Suspect sinusitis. Lung exam is completely normal. Plan: Ceftin, Astelin, Flonase. See instructions. Aortic disease per CT: Was referred to cardiology, they have been unable to contact him, recommend to call cardiology and get an appointment. RTC 4 weeks to be sure he is completely better, this is the second episode of cough and right-sided thoracic pain this winter.

## 2016-01-01 NOTE — Progress Notes (Signed)
Subjective:    Patient ID: Jonathan Ellison, male    DOB: [birth year], 66 y.o.   MRN: [id number]  DOS:  01/01/2016 Type of visit - description : Acute visit, here with his wife Interval history: Symptoms started 4 days ago with cough, decreased appetite, pain at  the right thoracic back. The cough is not better but he's having a lot of congestion and pain around his nose,  also having some muscle aches.   Review of Systems Had subjective fever with the onset of symptoms, that is better + Clear nasal discharge No difficulty breathing Mild sputum production, white. No wheezing.   Past Medical History  Diagnosis Date  . Kidney stones 2012  . 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 DVT (deep venous thrombosis) ([place]) 12-2012    large DVT, xarelto x life   . S/P IVC filter 2012  . Posthemorrhagic anemia   . Pancreatic benign neoplasm 90s    1.4 cm cystic structure over head of pancreas, present since mid to late 90s  . Hyperbilirubinemia   . Elevation of level of transaminase or lactic acid dehydrogenase (LDH)   . Closed fracture of proximal phalanx of finger     Right index finger  . Open fracture of lateral malleolus 80s    Left  . Rib fracture 2016    Left 9th and 10th rib, fall from ladder, landed on L elbow, shoulder and back, seen at [facility]    Past Surgical History  Procedure Laterality Date  . Cholecystectomy  2012    Laparoscopic  . Rotator cuff surgery Right 2008  . Ercp w/ sphicterotomy  2012    s/p cholestectomy in 2012, Dr. Alonza Bogus  . Umbilical hernia repair      Incarcerated  . Lithotripsy  2007    Dx: Left ESL  . Rotator cuff repair      mid 66's    Social History   Social History  . Marital Status: Married    Spouse Name: Alice  . Number of Children: 3  . Years of Education: N/A   Occupational History  . 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 Smoking status: Former Research scientist (life sciences)    . Smokeless tobacco: Never Used     Comment: Quit smoking 30 years ago.  . Alcohol Use: Yes     Comment: socially  . Drug Use: No  . Sexual Activity: Not on file   Other Topics Concern  . Not on file   Social History Narrative   Married.  Independent of ADLs and with ambulation.        Medication List       This list is accurate as of: 01/01/16  8:53 PM.  Always use your most recent med list.               azelastine 0.1 % nasal spray  Commonly known as:  ASTELIN  Place 2 sprays into both nostrils at bedtime as needed for rhinitis. Use in each nostril as directed     cefUROXime 500 MG tablet  Commonly known as:  CEFTIN  Take 1 tablet (500 mg total) by mouth 2 (two) times daily with a meal.     HYDROcodone-homatropine 5-1.5 MG/5ML syrup  Commonly known as:  HYCODAN  Take 5 mLs by mouth at bedtime as needed for cough.     rivaroxaban  20 MG Tabs tablet  Commonly known as:  XARELTO  Take 1 tablet (20 mg total) by mouth daily with supper.           Objective:   Physical Exam BP 106/80 mmHg  Pulse 79  Temp(Src) 98.2 F (36.8 C) (Oral)  Ht 5\' 7"  (1.702 m)  Wt 157 lb 6.4 oz (71.396 kg)  BMI 24.65 kg/m2  SpO2 98% General:   Well developed, well nourished . NAD.  HEENT:  Normocephalic . Face symmetric, atraumatic. TMs: Normal. Nose congested, sinuses TTP throughout the maxillary and frontal areas bilaterally. Throat symmetric and not red Lungs:  CTA B Normal respiratory effort, no intercostal retractions, no accessory muscle use. Heart: RRR,  no murmur.  No pretibial edema bilaterally  MSK: No TTP at the thoracic spine or as well.chest wall  Skin: Not pale. Not jaundice Neurologic:  alert & oriented X3.  Speech normal, gait appropriate for age and unassisted Psych--  Cognition and judgment appear intact.  Cooperative with normal attention span and concentration.  Behavior appropriate. No anxious or depressed appearing.      Assessment & Plan:    Assessment> Kidney stones ED Aortic stenosis? -- per CT 11/13/2015 Hematology: --PE, DVT 2012, after surgery IVC filter, Coumadin for 6 months --DVT, large 12-2012, developed distal to IVC, s/p thrombolytic therapy --Saw Dr Earlie Server -- Prescribed Xarelto for life --Phlegmasia cerulea dolens  PLAN Sinusitis -- Having cough, sinus pain and congestion for the last 4 days, + subjective fever. Suspect sinusitis. Lung exam is completely normal. Plan: Ceftin, Astelin, Flonase. See instructions. Aortic disease per CT: Was referred to cardiology, they have been unable to contact him, recommend to call cardiology and get an appointment. RTC 4 weeks to be sure he is completely better, this is the second episode of cough and right-sided thoracic pain this winter.

## 2016-03-14 IMAGING — CT CT ANGIO CHEST
2 of 8 series · 18 of 46 positions shown · IV contrast (omnipaque)
Comparison: Abdominal pelvic CT 12/25/2012.  Chest CT 11/16/2010.

CLINICAL DATA: Cough with shortness of breath, difficulty breathing
and right-sided chest pain for 3 days. History of DVT. Evaluate for
pulmonary embolism.

EXAM:
CT ANGIOGRAPHY CHEST WITH CONTRAST
TECHNIQUE: Multidetector CT imaging of the chest was performed using the
standard protocol during bolus administration of intravenous
contrast. Multiplanar CT image reconstructions and MIPs were
obtained to evaluate the vascular anatomy.
CONTRAST:  100mL OMNIPAQUE IOHEXOL 350 MG/ML SOLN

[Series 6: thins · axial · 0.68mm/px · z∈[-290,-51]mm · 15 of 263 slices shown]
[im 12/263  lung]
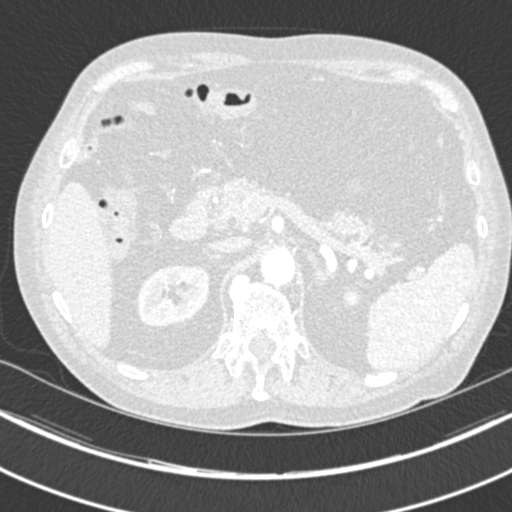
[im 36/263  soft-tissue]
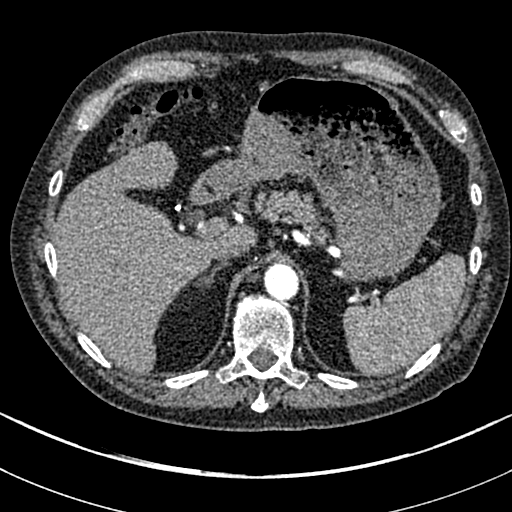
[im 48/263  lung]
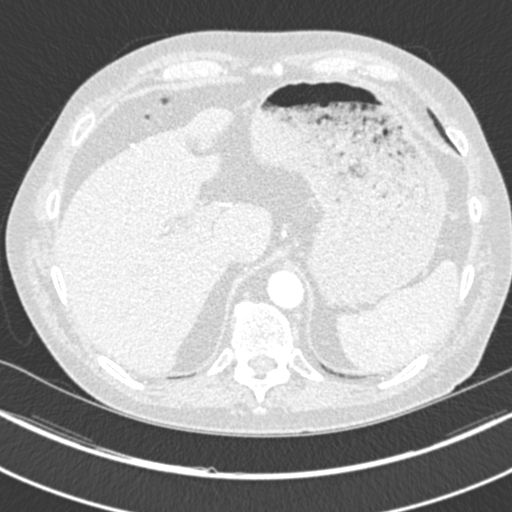
[im 60/263  soft-tissue]
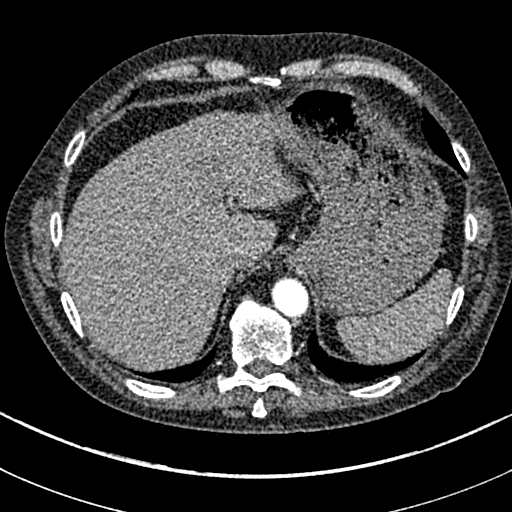
[im 84/263  lung]
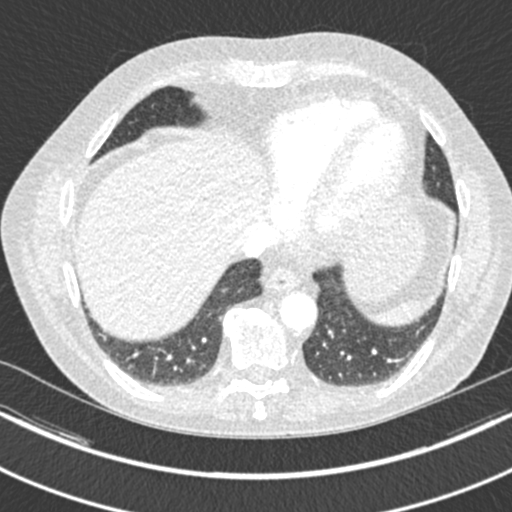
[im 96/263  soft-tissue]
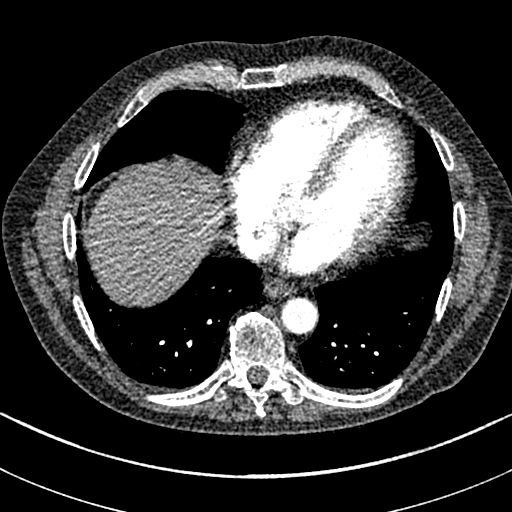
[im 120/263  lung]
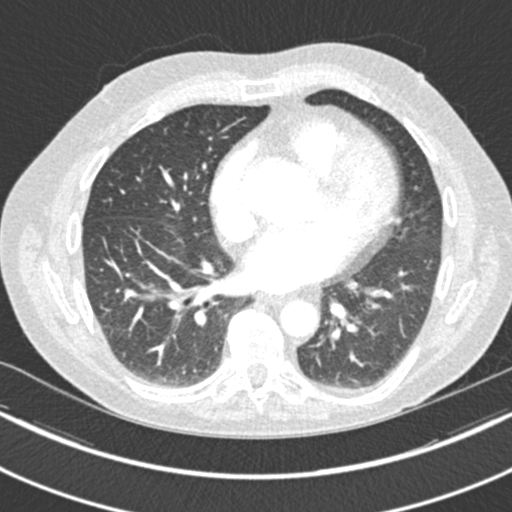
[im 132/263  soft-tissue]
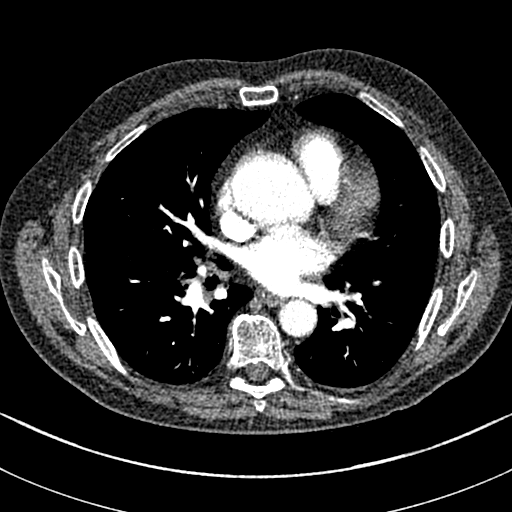
[im 143/263  lung]
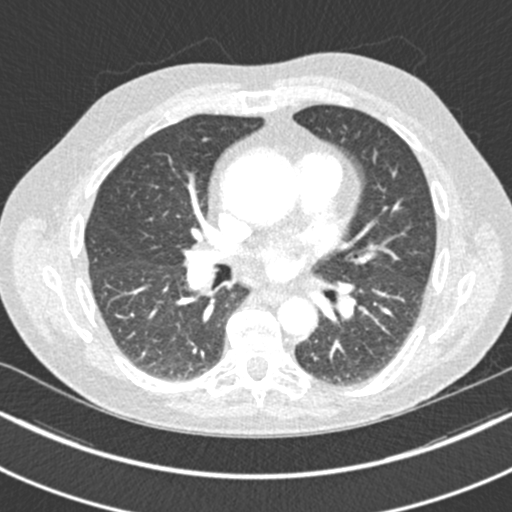
[im 167/263  soft-tissue]
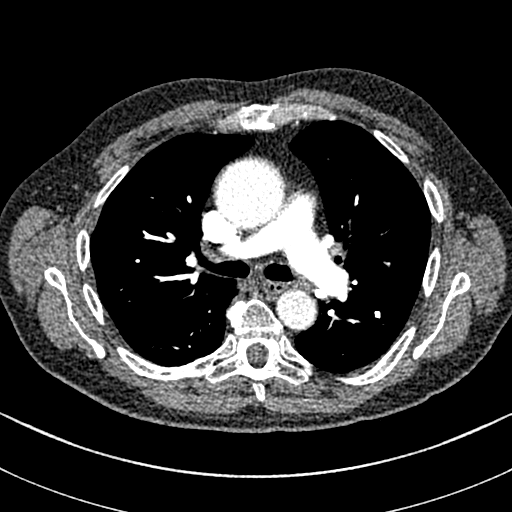
[im 179/263  lung]
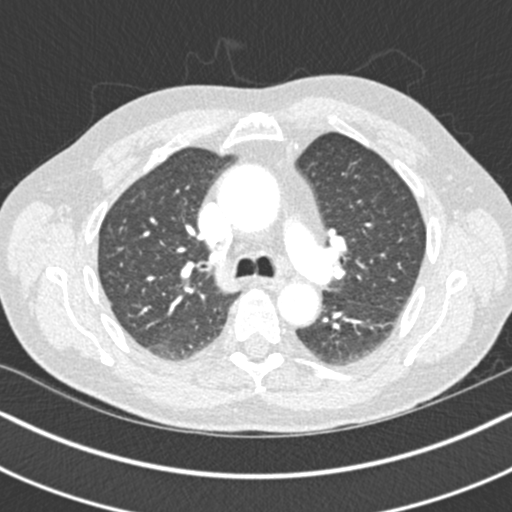
[im 203/263  soft-tissue]
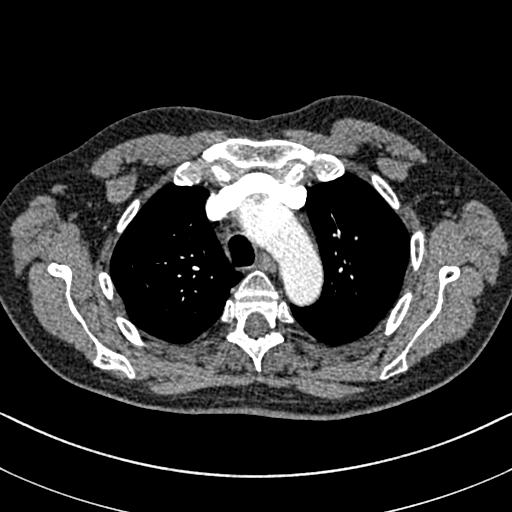
[im 215/263  lung]
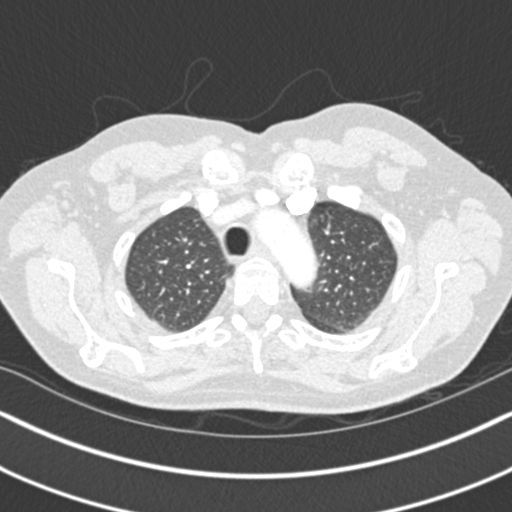
[im 227/263  soft-tissue]
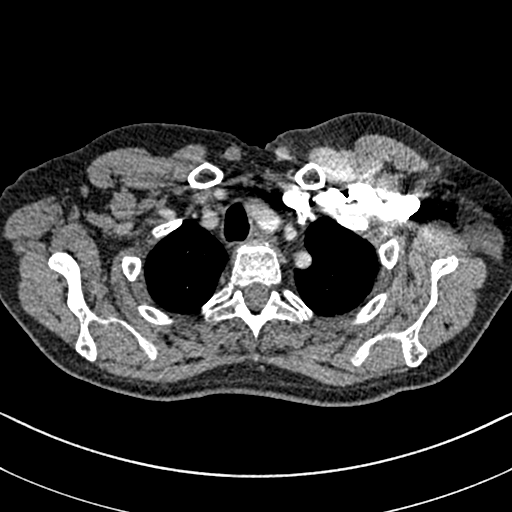
[im 251/263  lung]
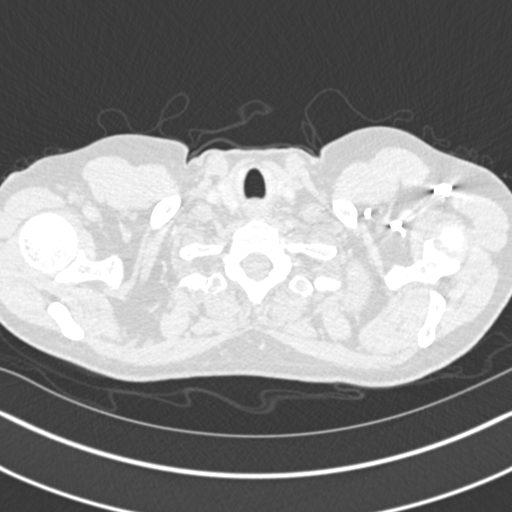

[Series 7: coronal mpr · coronal · 0.57mm/px · 3 of 127 slices shown]
[im 32/127  soft-tissue]
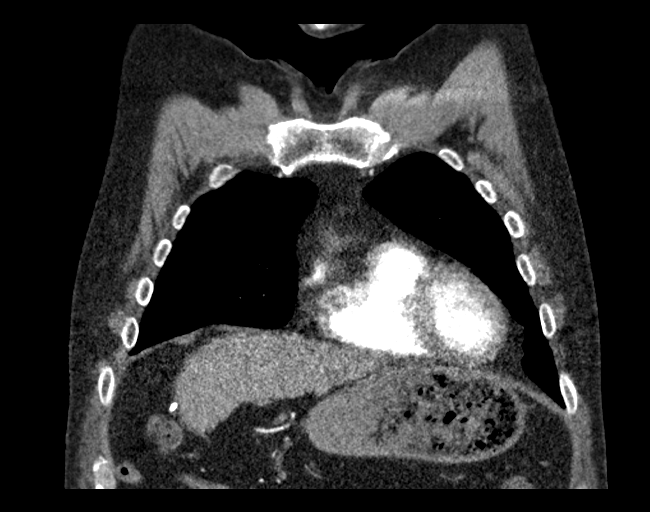
[im 64/127  soft-tissue]
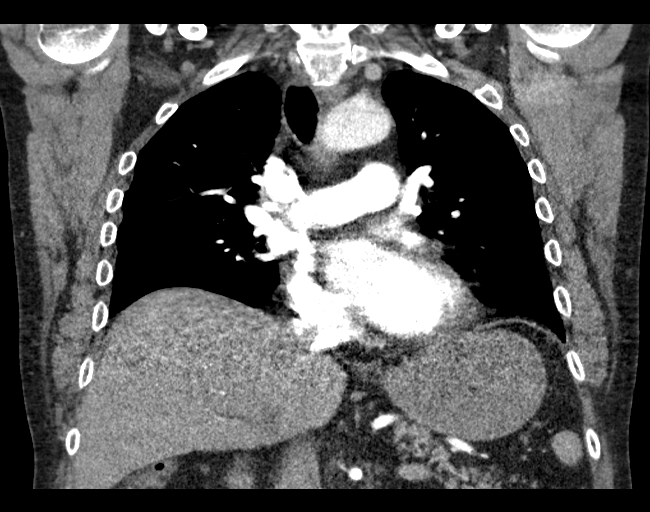
[im 95/127  soft-tissue]
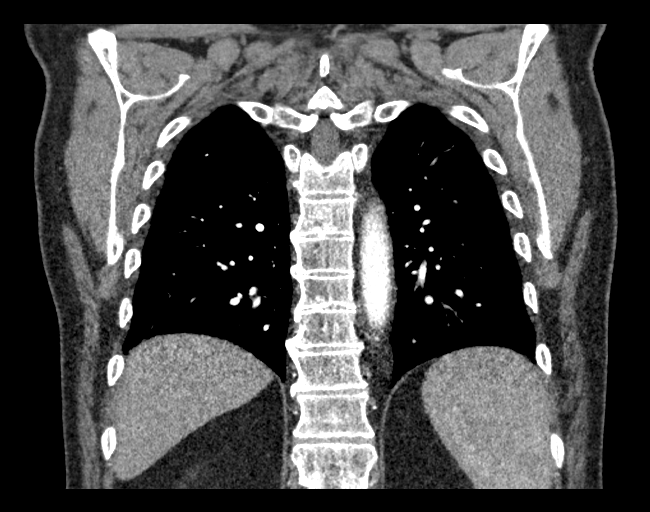

[18 of 46 positions shown; findings below may reference images not displayed]

FINDINGS: Mediastinum: The pulmonary arteries are well opacified with
contrast. There is no evidence of acute pulmonary embolism. There
are probable aortic valvular calcifications with dilatation of the
ascending aorta to at least 4.6 cm. This appears mildly progressive
compared with the prior exam. No evidence of dissection or focal
aneurysm.The heart size is normal. There is no pericardial effusion.
There are no enlarged mediastinal, hilar or axillary lymph nodes.
There are calcified left hilar lymph nodes.

Lungs/Pleura: There is no pleural effusion.There are calcified
granulomas in both lungs. No suspicious pulmonary nodule or
confluent airspace opacities demonstrated.

Upper abdomen: The visualized upper abdomen appears stable status
post cholecystectomy.

Musculoskeletal/Chest wall: No chest wall lesion or acute osseous
findings.

Review of the MIP images confirms the above findings.
IMPRESSION: 1. No evidence of acute pulmonary embolism or other acute chest
process.
2. Moderate dilatation of the ascending aorta with probable
underlying aortic valvular calcifications suggesting aortic
stenosis. Consider echocardiography. Recommend semi-annual imaging
followup by CTA or MRA and referral to cardiothoracic surgery if not
already obtained. This recommendation follows 1909
ACCF/AHA/AATS/ACR/ASA/SCA/DREY/SAULIUS DIANA/JANKI/MD ALANIN Guidelines for the
Diagnosis and Management of Patients With Thoracic Aortic Disease.
Circulation. 1909; 121: e266-e369
3. Evidence of prior granulomatous disease with calcified granulomas
and lymph nodes bilaterally.

## 2016-07-23 ENCOUNTER — Other Ambulatory Visit: Payer: Self-pay | Admitting: Internal Medicine

## 2016-08-04 ENCOUNTER — Encounter: Payer: Self-pay | Admitting: Internal Medicine

## 2016-08-04 ENCOUNTER — Ambulatory Visit (INDEPENDENT_AMBULATORY_CARE_PROVIDER_SITE_OTHER): Payer: Commercial Managed Care - HMO | Admitting: Internal Medicine

## 2016-08-04 VITALS — BP 108/70 | HR 72 | Temp 97.5°F | Resp 14 | Ht 67.0 in | Wt 157.2 lb

## 2016-08-04 DIAGNOSIS — W19XXXA Unspecified fall, initial encounter: Secondary | ICD-10-CM

## 2016-08-04 DIAGNOSIS — I779 Disorder of arteries and arterioles, unspecified: Secondary | ICD-10-CM | POA: Diagnosis not present

## 2016-08-04 DIAGNOSIS — Z86718 Personal history of other venous thrombosis and embolism: Secondary | ICD-10-CM | POA: Diagnosis not present

## 2016-08-04 DIAGNOSIS — H539 Unspecified visual disturbance: Secondary | ICD-10-CM

## 2016-08-04 MED ORDER — RIVAROXABAN 20 MG PO TABS: 20.0000 mg | ORAL_TABLET | Freq: Every day | ORAL | 6 refills | Status: DC

## 2016-08-04 NOTE — Progress Notes (Signed)
Pre visit review using our clinic review tool, if applicable. No additional management support is needed unless otherwise documented below in the visit note. 

## 2016-08-04 NOTE — Progress Notes (Signed)
Subjective:    Patient ID: Jonathan Ellison, male    DOB: 10/11/1951, 65 y.o.   MRN: [id number]  DOS:  08/04/2016 Type of visit - description : ROV Interval history: The patient is moving to [place] soon and his main concern is get a Xarelto RF. In addition, he said that he fell from a ladder from 5-6 feet last week, landed on his right side, denies loss of consciousness or major injuries. Also, yesterday was driving and noted like a flashlight at the external area of the  right visual field, also described as "like a hair was there". Denies amaurosis fugax or partial blindness on the R field.    Review of Systems Did a lot of walking lately working at [employer], has developed a right heel pain. Thinks related to plantar fasciitis (declined eval for it). Some right knee pain. No swelling  Denies any fever chills. No rash, no HAs. Has occasional neck pain, at baseline No nausea vomiting  Has occasional dizziness, and baseline.  Past Medical History:  Diagnosis Date  . Closed fracture of proximal phalanx of finger    Right index finger  . DVT (deep venous thrombosis) ([place]) 12-2012   large DVT, xarelto x life   . Elevation of level of transaminase or lactic acid dehydrogenase (LDH)   . Hyperbilirubinemia   . Kidney stones 2012  . Open fracture of lateral malleolus 80s   Left  . Pancreatic benign neoplasm 90s   1.4 cm cystic structure over head of pancreas, present since mid to late 90s  . Posthemorrhagic anemia   . 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 Rib fracture 2016   Left 9th and 10th rib, fall from ladder, landed on L elbow, shoulder and back, seen at [facility]  . S/P IVC filter 2012    Past Surgical History:  Procedure Laterality Date  . CHOLECYSTECTOMY  2012   Laparoscopic  . ERCP W/ SPHICTEROTOMY  2012   s/p cholestectomy in 2012, Dr. Alonza Bogus  . LITHOTRIPSY  2007   Dx: Left ESL  . ROTATOR  CUFF REPAIR     mid 90's  . Rotator cuff surgery Right 2008  . UMBILICAL HERNIA REPAIR     Incarcerated    Social History   Social History  . Marital status: Married    Spouse name: Alice  . Number of children: 3  . Years of education: N/A   Occupational History  . 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 Smoking status: Former Research scientist (life sciences)  . Smokeless tobacco: Never Used     Comment: Quit smoking 30 years ago.  . Alcohol use Yes     Comment: socially  . Drug use: No  . Sexual activity: Not on file   Other Topics Concern  . Not on file   Social History Narrative   Married.  Independent of ADLs and with ambulation.        Medication List       Accurate as of 08/04/16  4:52 PM. Always use your most recent med list.          rivaroxaban 20 MG Tabs tablet Commonly known as:  XARELTO Take 1 tablet (20 mg total) by mouth daily with supper.          Objective:   Physical Exam BP 108/70 (BP Location: Left Arm, Patient Position: Sitting,  Cuff Size: Normal)   Pulse 72   Temp 97.5 F (36.4 C) (Oral)   Resp 14   Ht 5\' 7"  (1.702 m)   Wt 157 lb 4 oz (71.3 kg)   SpO2 98%   BMI 24.63 kg/m  General:   Well developed, well nourished . NAD.  HEENT:  Normocephalic . Face symmetric, atraumatic EOMI, pupils equal and reactive. Nondilated funduscopy: Right side with no obvious abnormalities, left side very limited Neck: No TTP at the cervical spine, range of motion is slightly limited when he tries to turn the head L or R. Lungs:  CTA B Normal respiratory effort, no intercostal retractions, no accessory muscle use. Heart: RRR,  no murmur.  No pretibial edema bilaterally  Skin: Not pale. Not jaundice Neurologic:  alert & oriented X3.  Speech normal, gait appropriate for age and unassisted Psych--  Cognition and judgment appear intact.  Cooperative with normal attention span and concentration.  Behavior appropriate. No anxious or depressed appearing.        Assessment & Plan:   Assessment> Kidney stones ED Aortic stenosis? -- per CT 11/13/2015 Hematology: --PE, DVT 2012, after surgery IVC filter, Coumadin for 6 months --DVT, large 12-2012, developed distal to IVC, s/p thrombolytic therapy; IVC filter never removed  --Saw Dr Earlie Server -- Prescribed Xarelto for life --Phlegmasia cerulea dolens  PLAN Long term anticoagulation due to history of bilateral DVT and PE. Refill Xarelto. Fall: No apparent major trauma, he is anticoagulated, he works sometime in Academic librarian (!).  Strongly recommend not to expose himself to accidents. See instructions Visual disturbance: Sx onset late last night, some problem early today but now asx ; I am  concerned about sx, eye exam is normal including a undilated right funduscopy. I called his optometrist >>> needs urgent eval if sx persist otherwise he can see him in ~ 2 weeks. Plan: refer to ophthalmology asap  Aortic stenosis? Never did see a cardiologist. Encouraged to discuss that with his new PCP. Declined a flu shot Moving to [place] soon, encouraged to establish with a PCP there, printed a number of records to facilitate the transition

## 2016-08-04 NOTE — Patient Instructions (Signed)
We trying  to refer you to an eye doctor soon as possible  If you have any worsening eye symptoms ---->  go to the emergency room  Avoid  risky situations that could trigger falls.  Get established with another primary doctor in [place] soon as you can  You need to see a cardiologist   Fall Prevention in the Home  Falls can cause injuries and can affect people from all age groups. There are many simple things that you can do to make your home safe and to help prevent falls. WHAT CAN I DO ON THE OUTSIDE OF MY HOME?  Regularly repair the edges of walkways and driveways and fix any cracks.  Remove high doorway thresholds.  Trim any shrubbery on the main path into your home.  Use bright outdoor lighting.  Clear walkways of debris and clutter, including tools and rocks.  Regularly check that handrails are securely fastened and in good repair. Both sides of any steps should have handrails.  Install guardrails along the edges of any raised decks or porches.  Have leaves, snow, and ice cleared regularly.  Use sand or salt on walkways during winter months.  In the garage, clean up any spills right away, including grease or oil spills. WHAT CAN I DO IN THE BATHROOM?  Use night lights.  Install grab bars by the toilet and in the tub and shower. Do not use towel bars as grab bars.  Use non-skid mats or decals on the floor of the tub or shower.  If you need to sit down while you are in the shower, use a plastic, non-slip stool.Marland Kitchen  Keep the floor dry. Immediately clean up any water that spills on the floor.  Remove soap buildup in the tub or shower on a regular basis.  Attach bath mats securely with double-sided non-slip rug tape.  Remove throw rugs and other tripping hazards from the floor. WHAT CAN I DO IN THE BEDROOM?  Use night lights.  Make sure that a bedside light is easy to reach.  Do not use oversized bedding that drapes onto the floor.  Have a firm chair that has  side arms to use for getting dressed.  Remove throw rugs and other tripping hazards from the floor. WHAT CAN I DO IN THE KITCHEN?   Clean up any spills right away.  Avoid walking on wet floors.  Place frequently used items in easy-to-reach places.  If you need to reach for something above you, use a sturdy step stool that has a grab bar.  Keep electrical cables out of the way.  Do not use floor polish or wax that makes floors slippery. If you have to use wax, make sure that it is non-skid floor wax.  Remove throw rugs and other tripping hazards from the floor. WHAT CAN I DO IN THE STAIRWAYS?  Do not leave any items on the stairs.  Make sure that there are handrails on both sides of the stairs. Fix handrails that are broken or loose. Make sure that handrails are as long as the stairways.  Check any carpeting to make sure that it is firmly attached to the stairs. Fix any carpet that is loose or worn.  Avoid having throw rugs at the top or bottom of stairways, or secure the rugs with carpet tape to prevent them from moving.  Make sure that you have a light switch at the top of the stairs and the bottom of the stairs. If you do not  have them, have them installed. WHAT ARE SOME OTHER FALL PREVENTION TIPS?  Wear closed-toe shoes that fit well and support your feet. Wear shoes that have rubber soles or low heels.  When you use a stepladder, make sure that it is completely opened and that the sides are firmly locked. Have someone hold the ladder while you are using it. Do not climb a closed stepladder.  Add color or contrast paint or tape to grab bars and handrails in your home. Place contrasting color strips on the first and last steps.  Use mobility aids as needed, such as canes, walkers, scooters, and crutches.  Turn on lights if it is dark. Replace any light bulbs that burn out.  Set up furniture so that there are clear paths. Keep the furniture in the same spot.  Fix any  uneven floor surfaces.  Choose a carpet design that does not hide the edge of steps of a stairway.  Be aware of any and all pets.  Review your medicines with your healthcare provider. Some medicines can cause dizziness or changes in blood pressure, which increase your risk of falling. Talk with your health care provider about other ways that you can decrease your risk of falls. This may include working with a physical therapist or trainer to improve your strength, balance, and endurance.   This information is not intended to replace advice given to you by your health care provider. Make sure you discuss any questions you have with your health care provider.   Document Released: 09/19/2002 Document Revised: 02/13/2015 Document Reviewed: 11/03/2014 Elsevier Interactive Patient Education [employer].

## 2016-08-05 NOTE — Assessment & Plan Note (Signed)
Long term anticoagulation due to history of bilateral DVT and PE. Refill Xarelto. Fall: No apparent major trauma, he is anticoagulated, he works sometime in Academic librarian (!).  Strongly recommend not to expose himself to accidents. See instructions Visual disturbance: Sx onset late last night, some problem early today but now asx ; I am  concerned about sx, eye exam is normal including a undilated right funduscopy. I called his optometrist >>> needs urgent eval if sx persist otherwise he can see him in ~ 2 weeks. Plan: refer to ophthalmology asap  Aortic stenosis? Never did see a cardiologist. Encouraged to discuss that with his new PCP. Declined a flu shot Moving to [place] soon, encouraged to establish with a PCP there, printed a number of records to facilitate the transition

## 2016-09-08 ENCOUNTER — Telehealth: Payer: Self-pay | Admitting: Internal Medicine

## 2016-09-08 MED ORDER — RIVAROXABAN 20 MG PO TABS: 20.0000 mg | ORAL_TABLET | Freq: Every day | ORAL | 6 refills | Status: DC

## 2016-09-08 NOTE — Telephone Encounter (Signed)
Caller name: Relationship to patient: Self Can be reached: [phone] Pharmacy:  [facility], [place] - [address] [phone] (Phone) [phone] (Fax)     Reason for call: States he lost Rx for rivaroxaban (XARELTO) 20 MG TABS tablet [id number]

## 2016-09-08 NOTE — Telephone Encounter (Signed)
Rx sent to [facility].

## 2016-11-03 ENCOUNTER — Telehealth: Payer: Self-pay | Admitting: Internal Medicine

## 2016-11-03 NOTE — Telephone Encounter (Signed)
Patient relocated to [facility] and states PCP is aware therefore declined AWV.

## 2017-07-14 ENCOUNTER — Other Ambulatory Visit: Payer: Self-pay

## 2017-07-18 ENCOUNTER — Other Ambulatory Visit: Payer: Self-pay | Admitting: Internal Medicine

## 2017-08-18 ENCOUNTER — Other Ambulatory Visit: Payer: Self-pay | Admitting: Internal Medicine

## 2017-08-18 NOTE — Telephone Encounter (Signed)
According to my last note, he was moving to [place]. Okay to call #[phone], no refills.  Advised patient to get established with a new physician in [place].  Cannot continue to refill his medication.

## 2017-08-18 NOTE — Telephone Encounter (Signed)
Pt is requesting refill on Xarelto 20mg . Last OV: 07/2016. Please advise.

## 2017-08-18 NOTE — Telephone Encounter (Signed)
30 day supply sent
